# Patient Record
Sex: Female | Born: 1985
Health system: Southern US, Community
[De-identification: ages and names within clinical notes are randomized; demographics above are authoritative.]

## PROBLEM LIST (undated history)

## (undated) DIAGNOSIS — B379 Candidiasis, unspecified: Secondary | ICD-10-CM

## (undated) DIAGNOSIS — Z789 Other specified health status: Secondary | ICD-10-CM

## (undated) DIAGNOSIS — B9689 Other specified bacterial agents as the cause of diseases classified elsewhere: Secondary | ICD-10-CM

## (undated) DIAGNOSIS — Z3042 Encounter for surveillance of injectable contraceptive: Secondary | ICD-10-CM

## (undated) DIAGNOSIS — N910 Primary amenorrhea: Secondary | ICD-10-CM

## (undated) DIAGNOSIS — Z8679 Personal history of other diseases of the circulatory system: Secondary | ICD-10-CM

## (undated) DIAGNOSIS — N760 Acute vaginitis: Principal | ICD-10-CM

## (undated) DIAGNOSIS — L0291 Cutaneous abscess, unspecified: Secondary | ICD-10-CM

## (undated) DIAGNOSIS — Z8619 Personal history of other infectious and parasitic diseases: Secondary | ICD-10-CM

## (undated) HISTORY — DX: Cutaneous abscess, unspecified: L02.91

## (undated) HISTORY — DX: Candidiasis, unspecified: B37.9

## (undated) HISTORY — DX: Primary amenorrhea: N91.0

## (undated) HISTORY — DX: Personal history of other diseases of the circulatory system: Z86.79

## (undated) HISTORY — DX: Personal history of other infectious and parasitic diseases: Z86.19

## (undated) HISTORY — DX: Acute vaginitis: N76.0

## (undated) HISTORY — DX: Other specified bacterial agents as the cause of diseases classified elsewhere: B96.89

## (undated) HISTORY — PX: HERNIA REPAIR: SHX51

---
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
## 2006-02-05 ENCOUNTER — Ambulatory Visit: Payer: Self-pay | Admitting: Internal Medicine

## 2006-02-12 ENCOUNTER — Ambulatory Visit: Payer: Self-pay | Admitting: Internal Medicine

## 2006-02-12 ENCOUNTER — Other Ambulatory Visit: Admission: RE | Admit: 2006-02-12 | Discharge: 2006-02-12 | Payer: Self-pay | Admitting: Internal Medicine

## 2006-02-12 ENCOUNTER — Encounter: Payer: Self-pay | Admitting: Internal Medicine

## 2006-05-08 ENCOUNTER — Ambulatory Visit: Payer: Self-pay | Admitting: Internal Medicine

## 2007-06-07 ENCOUNTER — Telehealth: Payer: Self-pay | Admitting: Internal Medicine

## 2008-03-03 HISTORY — PX: TONSILLECTOMY: SUR1361

## 2008-07-04 ENCOUNTER — Ambulatory Visit: Payer: Self-pay | Admitting: Internal Medicine

## 2008-07-05 ENCOUNTER — Encounter: Payer: Self-pay | Admitting: Internal Medicine

## 2008-07-18 ENCOUNTER — Encounter: Payer: Self-pay | Admitting: *Deleted

## 2009-06-29 ENCOUNTER — Ambulatory Visit: Payer: Self-pay | Admitting: Internal Medicine

## 2009-08-01 LAB — CONVERTED CEMR LAB: Pap Smear: NORMAL

## 2010-03-13 ENCOUNTER — Telehealth: Payer: Self-pay | Admitting: *Deleted

## 2010-03-19 ENCOUNTER — Other Ambulatory Visit: Payer: Self-pay | Admitting: Internal Medicine

## 2010-03-19 ENCOUNTER — Ambulatory Visit
Admission: RE | Admit: 2010-03-19 | Discharge: 2010-03-19 | Payer: Self-pay | Source: Home / Self Care | Attending: Internal Medicine | Admitting: Internal Medicine

## 2010-03-20 LAB — CBC WITH DIFFERENTIAL/PLATELET
Basophils Absolute: 0 10*3/uL (ref 0.0–0.1)
Basophils Relative: 0.3 % (ref 0.0–3.0)
Eosinophils Absolute: 0.1 10*3/uL (ref 0.0–0.7)
Eosinophils Relative: 1.3 % (ref 0.0–5.0)
HCT: 36.5 % (ref 36.0–46.0)
Hemoglobin: 12.5 g/dL (ref 12.0–15.0)
Lymphocytes Relative: 26.9 % (ref 12.0–46.0)
Lymphs Abs: 1.3 10*3/uL (ref 0.7–4.0)
MCHC: 34.2 g/dL (ref 30.0–36.0)
MCV: 94.3 fl (ref 78.0–100.0)
Monocytes Absolute: 0.4 10*3/uL (ref 0.1–1.0)
Monocytes Relative: 7.3 % (ref 3.0–12.0)
Neutro Abs: 3.1 10*3/uL (ref 1.4–7.7)
Neutrophils Relative %: 64.2 % (ref 43.0–77.0)
Platelets: 269 10*3/uL (ref 150.0–400.0)
RBC: 3.88 Mil/uL (ref 3.87–5.11)
RDW: 13.1 % (ref 11.5–14.6)
WBC: 4.9 10*3/uL (ref 4.5–10.5)

## 2010-03-20 LAB — BASIC METABOLIC PANEL
BUN: 13 mg/dL (ref 6–23)
CO2: 29 mEq/L (ref 19–32)
Calcium: 9.5 mg/dL (ref 8.4–10.5)
Chloride: 105 mEq/L (ref 96–112)
Creatinine, Ser: 0.6 mg/dL (ref 0.4–1.2)
GFR: 168.64 mL/min (ref >60.00)
Glucose, Bld: 98 mg/dL (ref 70–99)
Potassium: 3.3 mEq/L — ABNORMAL LOW (ref 3.5–5.1)
Sodium: 141 mEq/L (ref 135–145)

## 2010-03-20 LAB — LIPID PANEL
Cholesterol: 157 mg/dL (ref 0–200)
HDL: 65.4 mg/dL (ref >39.00)
LDL Cholesterol: 77 mg/dL (ref 0–99)
Total CHOL/HDL Ratio: 2
Triglycerides: 74 mg/dL (ref 0.0–149.0)
VLDL: 14.8 mg/dL (ref 0.0–40.0)

## 2010-03-20 LAB — HEPATIC FUNCTION PANEL
ALT: 11 U/L (ref 0–35)
AST: 20 U/L (ref 0–37)
Albumin: 4.4 g/dL (ref 3.5–5.2)
Alkaline Phosphatase: 41 U/L (ref 39–117)
Bilirubin, Direct: 0 mg/dL (ref 0.0–0.3)
Total Bilirubin: 0.4 mg/dL (ref 0.3–1.2)
Total Protein: 7.8 g/dL (ref 6.0–8.3)

## 2010-03-20 LAB — TSH: TSH: 0.45 u[IU]/mL (ref 0.35–5.50)

## 2010-03-21 ENCOUNTER — Ambulatory Visit
Admission: RE | Admit: 2010-03-21 | Discharge: 2010-03-21 | Payer: Self-pay | Source: Home / Self Care | Attending: Internal Medicine | Admitting: Internal Medicine

## 2010-04-02 NOTE — Assessment & Plan Note (Signed)
Summary: sharp pain in neck head, especially when pt bends over/cjr pt...   Vital Signs:  Patient profile:   25 year old female Menstrual status:  irregular LMP:     07/01/2008 Height:      62.5 inches Weight:      144 pounds BMI:     26.01 Temp:     98.5 degrees F oral Pulse rate:   78 / minute BP sitting:   110 / 60  (right arm) Cuff size:   regular  Vitals Entered By: Romualdo Bolk, CMA (AAMA) (June 29, 2009 3:11 PM) CC: Pt has been having sharp pains in the back of her neck when she turns her neck or when she goes from hot to cold. This has been going on for 3 weeks now. Pt has also not had a period since May 2010. She was on depo. Last shot was in July 2010. [facility]  LMP (date): 07/01/2008 LMP - Character: heavy Menarche (age onset years): 12   Menses interval (days): depo Menstrual flow (days): 7 Enter LMP: 07/01/2008   History of Present Illness: Christine Harris   3  weeks of shooting neck  pain lower mid neck and to sides   for about 3 weeks  insidious onset ? going to work  No injury or incitoers except bending  but notices Notice rest better.    and laying down . recently   is more frequent.    4-5 hours  for sleep  no arm pain  .     Is right handed . Puppy at home.    no hx of  concussion or head injury and was in [facility] at age 47 years.   no med currently  Also   amenorrhea   for a year   last dep in July and had period then    last preg test  2 days  .  ago  using condoms     Preventive Screening-Counseling & Management  Alcohol-Tobacco     Alcohol drinks/day: <1     Smoking Status: quit     Year Quit: 01/2009  Caffeine-Diet-Exercise     Caffeine use/day: 4     Does Patient Exercise: yes     Type of exercise: dance  Current Medications (verified): 1)  None  Allergies (verified): 1)  ! Codeine  Past History:  Care Management: Gynecology: [facility] Department Surgery: [place]  abscess  perianal ENT: Dorma Russell  Social History: hh of  4    no pets .  [employer]   12 hours  and [employer].  working  36- 40 hours .   4-5 hours of sleepSmoking Status:  quit Caffeine use/day:  4  Review of Systems  The patient denies anorexia, fever, vision loss, decreased hearing, chest pain, syncope, headaches, difficulty walking, abnormal bleeding, enlarged lymph nodes, and angioedema.    Physical Exam  General:  Well-developed,well-nourished,in no acute distress; alert,appropriate and cooperative throughout examination Head:  normocephalic and atraumatic.   Eyes:  vision grossly intact, pupils equal, pupils round, and pupils reactive to light.   Ears:  R ear normal, L ear normal, and no external deformities.   Nose:  no external deformity and no nasal discharge.   Mouth:  pharynx pink and moist.  tongue piercing noted  Neck:  full ROM and no masses.  slightly tender midline and trapezious right more than left  Lungs:  Normal respiratory effort, chest expands symmetrically. Lungs  are clear to auscultation, no crackles or wheezes. Heart:  Normal rate and regular rhythm. S1 and S2 normal without gallop, murmur, click, rub or other extra sounds. Msk:  normal ROM, no joint warmth, no redness over joints, and no joint deformities.   Pulses:  pulses intact without delay   Extremities:  no clubbing cyanosis or edema  Neurologic:  alert & oriented X3, strength normal in all extremities, gait normal, and DTRs symmetrical and normal.   Skin:  turgor normal, color normal, and tattoo(s).   Cervical Nodes:  No lymphadenopathy noted Psych:  Oriented X3, good eye contact, not anxious appearing, and not depressed appearing.  cognition normal.   Impression & Recommendations:  Problem # 1:  NECK PAIN (ICD-723.1) Mechanical   cause     ;sleep deprivations adding   HO on exercise and posture given also   no weakness or radicular signs  The following medications were removed from the medication list:    Co-gesic  5-500 Mg Tabs (Hydrocodone-acetaminophen) .[name]... 1 by mouth q4-6 hours as needed pain Her updated medication list for this problem includes:    Naproxen 500 Mg Tabs (Naproxen) .[name]... 1 by mouth two times a day  as directed for neck pan    Cyclobenzaprine Hcl 10 Mg Tabs (Cyclobenzaprine hcl) .[name]... 1/2 -1  by mouth hs  if needed for muscle spasm  Problem # 2:  OTH GENERAL CNSL&ADVICE CONTRACEPT MANAGEMENT (ICD-V25.09) counseled about   use on contraception     pt may have unrealistic   expectations of ocass period  reliability and  importance  of adherance to taking daily.     disc options.   Complete Medication List: 1)  Naproxen 500 Mg Tabs (Naproxen) .[name].. 1 by mouth two times a day  as directed for neck pan 2)  Cyclobenzaprine Hcl 10 Mg Tabs (Cyclobenzaprine hcl) .... 1/2 -1  by mouth hs  if needed for muscle spasm  Patient Instructions: 1)  i think you have  muscle spasm strain in neck. 2)  take antiinflammatory for 10-14 days and then as needed. 3)  Muscle relaxant may help some but can make your drowsy.  4)  If not getting better in 3 weeks or so then return office visit  or call.  Prescriptions: CYCLOBENZAPRINE HCL 10 MG TABS (CYCLOBENZAPRINE HCL) 1/2 -1  by mouth hs  if needed for muscle spasm  #30 x 0   Entered and Authorized by:   Madelin Headings MD   Signed by:   Madelin Headings MD on 06/29/2009   Method used:   Print then Give to Patient   RxID:   [id number] NAPROXEN 500 MG TABS (NAPROXEN) 1 by mouth two times a day  as directed for neck pan  #40 x 1   Entered and Authorized by:   Madelin Headings MD   Signed by:   Madelin Headings MD on 06/29/2009   Method used:   Print then Give to Patient   RxID:   [id number]

## 2010-04-04 NOTE — Assessment & Plan Note (Signed)
Summary: ppd reading/ssc  Nurse Visit   Allergies: 1)  ! Codeine  PPD Results    Date of reading: 03/21/2010    Results: < 5mm    Interpretation: negative

## 2010-04-04 NOTE — Progress Notes (Signed)
Summary: Pt needs form filled out and cpx before end of next wk  Phone Note Call from Patient Call back at [phone] cell   Caller: Patient Summary of Call: Pt called and is needing a form completed for a job and pt was told that since she was last seen by Dr Fabian Sharp in April 2011, that she would need to sch another cpx with a diff doctor. Pt is needing to get this done before the end of next week. Pls advise.  Initial call taken by: Lucy Antigua,  March 13, 2010 4:43 PM  Follow-up for Phone Call        Spoke to pt and she needs to get this to get this form for work filled out by the end of next week. 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 Pt aware that we will have to discuss this with Dr. Fabian Sharp once she gets back. Can we use a WCC slot? Follow-up by: Romualdo Bolk, CMA Duncan Dull),  March 14, 2010 8:23 AM  Additional Follow-up for Phone Call Additional follow up Details #1::        ok Additional Follow-up by: Madelin Headings MD,  March 14, 2010 6:05 PM    Additional Follow-up for Phone Call Additional follow up Details #2::    Pt aware and appt made. Follow-up by: Romualdo Bolk, CMA (AAMA),  March 15, 2010 8:27 AM

## 2010-04-04 NOTE — Assessment & Plan Note (Signed)
Summary: cpx/ssc   Vital Signs:  Patient profile:   25 year old female Menstrual status:  irregular LMP:     03/14/2010 Height:      62.75 inches Weight:      146 pounds Pulse rate:   80 / minute BP sitting:   110 / 72  (right arm) Cuff size:   regular  Vitals Entered By: Romualdo Bolk, CMA (AAMA) (March 19, 2010 3:58 PM) CC: CPX no pap- Pt has a gyn who does paps. LMP (date): 03/14/2010 LMP - Character: heavy Menarche (age onset years): 12   Menses interval (days): depo Menstrual flow (days): 7 Enter LMP: 03/14/2010 Last PAP Result normal   History of Present Illness: Christine Harris comes in for preventive visit  and form for job aces program [employer]   No concerns  no exposure tp tb . Periods ok was depo n the past. Dr Henderson Cloud for gyne.  Has reportedly have tdap  as a senior in HS.   Preventive Care Screening  Pap Smear:    Date:  08/01/2009    Results:  normal    Preventive Screening-Counseling & Management  Alcohol-Tobacco     Alcohol drinks/day: <1     Smoking Status: quit     Year Quit: 01/2009  Caffeine-Diet-Exercise     Caffeine use/day: 4     Diet Comments: no concerns     Does Patient Exercise: yes     Type of exercise: dance     [facility] Depression Score: no  Hep-HIV-STD-Contraception     Dental Visit-last 6 months no  Safety-Violence-Falls     Seat Belt Use: no     Seat Belt Counseling: to use seat belts when in vehicle     Firearms in the Home: no firearms in the home     Smoke Detectors: yes      Blood Transfusions:  no.    Current Medications (verified): 1)  None  Allergies (verified): 1)  ! Codeine  Past History:  Past medical, surgical, family and social histories (including risk factors) reviewed, and no changes noted (except as noted below).  Past Medical History: Reviewed history from 07/04/2008 and no changes required. Unremarkable 7#7oz c sect ft   Past Surgical History: Reviewed history from 07/04/2008 and no  changes required. Tubes in Ears at age 9 and 2 Umbical Hernia age 36 Tonsillectomy in 01/2008 Surgery for boil on inside buttock Perianal and required Gen anesth2002  Past History:  Care Management: Gynecology: [facility] Department in the past- Dr. Cher Nakai Surgery: Gwenlyn Found  abscess perianal ENT: Dorma Russell  Family History: Reviewed history from 07/04/2008 and no changes required. Father: Healthy Mother: Healthy  Siblings:  Asthma  Social History: Reviewed history from 06/29/2009 and no changes required. 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  Hh of 3  6  hours of sleepDental Care w/in 6 mos.:  no Seat Belt Use:  no Blood Transfusions:  no  Review of Systems       12 system review negative    Physical Exam  General:  Well-developed,well-nourished,in no acute distress; alert,appropriate and cooperative throughout examination Head:  Normocephalic and atraumatic without obvious abnormalities. No apparent alopecia or balding. Eyes:  PERRL, EOMs full, conjunctiva clear  Ears:  R ear normal, L ear normal, no external deformities, and ear piercing(s) noted.   Nose:  no external deformity, no external erythema, and no nasal discharge.   Mouth:  good dentition and pharynx pink and moist.   piercing Neck:  No deformities, masses, or tenderness noted. Breasts:  No mass, nodules, thickening, tenderness, bulging, retraction, inflamation, nipple discharge or skin changes noted.   Lungs:  Normal respiratory effort, chest expands symmetrically. Lungs are clear to auscultation, no crackles or wheezes. Heart:  Normal rate and regular rhythm. S1 and S2 normal without gallop, murmur, click, rub or other extra sounds. Abdomen:  Bowel sounds positive,abdomen soft and non-tender without masses, organomegaly or hernias noted. Genitalia:  per gyne Msk:  no joint swelling, no joint warmth, no redness over joints, and no joint  deformities.   Pulses:  pulses intact without delay   Extremities:  no clubbing cyanosis or edema  Neurologic:  alert & oriented X3, strength normal in all extremities, gait normal, and DTRs symmetrical and normal.   Skin:  turgor normal, color normal, no petechiae, no purpura, tattoo(s), and body piercing(s).   Cervical Nodes:  No lymphadenopathy noted Axillary Nodes:  No palpable lymphadenopathy Inguinal Nodes:  No significant adenopathy Psych:  Normal eye contact, appropriate affect. Cognition appears normal.    Impression & Recommendations:  Problem # 1:  PREVENTIVE HEALTH CARE (ICD-V70.0) hea;thy  ppd placed to day   form  to be completed for  aces [employer]   .  disc screening labs  Orders: TLB-BMP (Basic Metabolic Panel-BMET) (80048-METABOL) TLB-CBC Platelet - w/Differential (85025-CBCD) TLB-TSH (Thyroid Stimulating Hormone) (84443-TSH) TLB-Hepatic/Liver Function Pnl (80076-HEPATIC) TLB-Lipid Panel (80061-LIPID) Specimen Handling (16109) Venipuncture (60454)  Other Orders: TB Skin Test (09811) Admin 1st Vaccine (91478)  Patient Instructions: 1)  check tb reading in 48-72 hours   and get form then 2)  You will be informed of lab results when available.  3)  wear seat belt for injury prevention   Orders Added: 1)  TB Skin Test [86580] 2)  Admin 1st Vaccine [90471] 3)  TLB-BMP (Basic Metabolic Panel-BMET) [80048-METABOL] 4)  TLB-CBC Platelet - w/Differential [85025-CBCD] 5)  TLB-TSH (Thyroid Stimulating Hormone) [84443-TSH] 6)  TLB-Hepatic/Liver Function Pnl [80076-HEPATIC] 7)  TLB-Lipid Panel [80061-LIPID] 8)  Specimen Handling [99000] 9)  Venipuncture [36415] 10)  Est. Patient 18-39 years [99395]   Immunizations Administered:  PPD Skin Test:    Vaccine Type: PPD    Site: right forearm    Mfr: Sanofi Pasteur    Dose: 0.1 ml    Route: ID    Given by: Romualdo Bolk, CMA (AAMA)    Exp. Date: 11/02/2011    Lot #: [id number]   Immunizations  Administered:  PPD Skin Test:    Vaccine Type: PPD    Site: right forearm    Mfr: Sanofi Pasteur    Dose: 0.1 ml    Route: ID    Given by: Romualdo Bolk, CMA (AAMA)    Exp. Date: 11/02/2011    Lot #: [id number]

## 2010-07-19 NOTE — Op Note (Signed)
[place]. [facility]  Patient:    Christine Harris, BREACH Visit Number: [id number] MRN: [id number]          Service Type: DSU Location: [facility] 2550 01 Attending Physician:  Leonia Corona Dictated by:   Judie Petit. Leonia Corona, M.D. Proc. Date: 03/02/01 Admit Date:  03/02/2001 Discharge Date: 03/02/2001                             Operative Report  PREOPERATIVE DIAGNOSIS:  Recurrent perineal abscess.  POSTOPERATIVE DIAGNOSIS:  Recurrent perineal abscess.  PROCEDURE:  Incision and drainage of left perineal abscess.  ANESTHESIA:  General laryngeal mask anesthesia.  SURGEON:  Nelida Meuse, M.D.  ASSISTANT:  Nurse.  INDICATION FOR PROCEDURE:  This 25 year old female child had incision and drainage of her perineal abscess about six months ago, which was completely drained and healed.  The patient has come back with a fluctuant, tender swelling of the same spot consistent with recurrent abscess; hence, the procedure.  DESCRIPTION OF PROCEDURE:  The patient was brought into the operating room, placed supine upon the operating table.  General laryngeal mask anesthesia is given, and the lithotomy position is given to the patient.  The perineal area was cleaned, prepped, and draped in usual manner.  The left buttock perineal a abscess started to leak before surgery.  The center of the 3 x 3 cm swelling was already pointing and leaked.  Pus cultures were obtained for aerobic and anaerobic cultures, and a blunt-tipped hemostat was inserted into the abscess cavity and the incision was made.  It appeared to be a very superficial abscess, about 2 x 2 cm in size.  The entire roof of the abscess was thin and papery, which was excised completely, and _____ of the abscess cavity was done, which was sent for biopsy.  The wound was irrigated and then packed with Vaseline gauze.  This was covered with sterile gauze, and tapes were applied. The patient tolerated the  procedure very well, which was smooth and uneventful.  The patient was later extubated and transported to the recovery room in good and stable condition. Dictated by:   Judie Petit. Leonia Corona, M.D. Attending Physician:  Leonia Corona DD:  03/02/01 TD:  03/02/01 Job: [id number]

## 2010-07-19 NOTE — Op Note (Signed)
[place]. [facility]  Patient:    Christine Harris, Christine Harris Visit Number: [id number] MRN: [id number]          Service Type: DSU Location: [facility] Attending Physician:  Leonia Corona Dictated by:   Judie Petit. Leonia Corona, M.D. Admit Date:  09/02/2001 Discharge Date: 09/02/2001   CC:         Neta Mends. Panosh, M.D. [facility]   Operative Report  PREOPERATIVE DIAGNOSIS:  Perineal abscess.  POSTOPERATIVE DIAGNOSIS:  Perineal abscess.  PROCEDURE:  Incision and drainage.  ANESTHESIA:  Local anesthesia.  SURGEON:  Nelida Meuse, M.D.  ASSISTANT:  Nurse.  DESCRIPTION OF PROCEDURE:  The patient is brought into minor operating room, where she was placed left laterally and with the right lower extremity folded into the chest and then the perineal abscess was clearly visible.  At this point approximately 3 cc of 1% lidocaine with epinephrine was infiltrated in and around the tiny abscess, which was almost pointing.  After local anesthesia, a small incision about 1 cm was made right at the tip of the abscess and the abscess was drained completely.  The cavity was irrigated with saline and packed with gauze smeared with Neosporin ointment.  A sterile gauze was placed, which was covered with Tegaderm dressing, which was covered with tape dressing.  The patient tolerated the procedure very well, which was smooth and uneventful.  The patient was later allowed to go home with instructions to continue daily dressing changes. Dictated by:   Judie Petit. Leonia Corona, M.D. Attending Physician:  Leonia Corona DD:  09/02/01 TD:  09/06/01 Job: [id number]

## 2010-07-19 NOTE — Op Note (Signed)
[place]. [facility]  Patient:    Christine Harris, Christine Harris Visit Number: [id number] MRN: [id number]          Service Type: DSU Location: [facility] Attending Physician:  Leonia Corona Proc. Date: 09/17/00 Adm. Date:  09/17/2000   CC:         Neta Mends. Panosh, M.D. [facility]   Operative Report  PREOPERATIVE DIAGNOSIS:  Perianal abscess.  POSTOPERATIVE DIAGNOSIS:  Perianal abscess.  PROCEDURE PERFORMED:  Examination under anesthesia with incision and drainage of perirectal abscess.  SURGEON: Evalee Mutton. Leeanne Mannan, M.D.  ASSISTANT:  Nurse.  ANESTHESIA:  General laryngeal mask anesthesia.  PROCEDURE IN DETAIL:  The patient was brought into the operating room and placed supine on the operating table.  General laryngeal mask anesthesia was induced.  The patient was moved into the lithotomy position.  Before beginning the incision and drainage, we did a perianal examination under anesthesia. The perivaginal examination and labial examination was done to rule out the possibility of a Bartholin cyst infection.  A superficial vaginal examination with the speculum was also done to see if there was a fistulous connection between the outer portion of the vaginal canal.  No such fistula was noted. We then did a digital rectal examination and no internal opening of the or communication with the abscess cavity was noted.  We then cleaned and prepped the patient and did a long elliptical incision over the swelling and a piece of skin and abscess wall were sent for biopsy.  A minimal amount of the abscess was drained.  After superficial abscess cavity, we noted another deeper cavity, which was also opened with blunt tip hemostat and the wall incised with cautery and the abscess cavity the finger was inserted in the deeper perirectal cavity and ______ and the minimal amount pus drained.  It had a very thick fibrous wall a part piece of which was also taken for biopsy. It appeared  that this must be the previous abscess cavity, which was drained about a year ago and healed.  Maybe this is a fibrous cavity of the previous abscess incompletely healed.  After draining the superficial and deeper cavities completely and probing with a finger, the flushed the abscess cavities with peroxide and then packed it with Iodoform gauze.  Sterile gauze was placed over it and tape was applied.  The patient tolerated the procedure very well, which was ______ patient was later extubated and transported to recovery room in good and stable condition. Attending Physician:  Leonia Corona DD:  09/17/00 TD:  09/17/00 Job: [id number]

## 2010-12-05 DIAGNOSIS — B9689 Other specified bacterial agents as the cause of diseases classified elsewhere: Secondary | ICD-10-CM

## 2010-12-05 HISTORY — DX: Other specified bacterial agents as the cause of diseases classified elsewhere: B96.89

## 2011-06-11 DIAGNOSIS — B9689 Other specified bacterial agents as the cause of diseases classified elsewhere: Secondary | ICD-10-CM | POA: Insufficient documentation

## 2011-06-12 ENCOUNTER — Other Ambulatory Visit: Payer: Self-pay

## 2011-06-19 ENCOUNTER — Other Ambulatory Visit: Payer: Self-pay

## 2011-06-23 ENCOUNTER — Other Ambulatory Visit: Payer: Self-pay

## 2011-10-29 ENCOUNTER — Encounter: Payer: Self-pay | Admitting: Internal Medicine

## 2011-10-29 ENCOUNTER — Ambulatory Visit (INDEPENDENT_AMBULATORY_CARE_PROVIDER_SITE_OTHER): Payer: BC Managed Care – PPO | Admitting: Internal Medicine

## 2011-10-29 VITALS — BP 96/60 | HR 76 | Temp 98.6°F | Wt 146.0 lb

## 2011-10-29 DIAGNOSIS — Z789 Other specified health status: Secondary | ICD-10-CM | POA: Insufficient documentation

## 2011-10-29 DIAGNOSIS — Z9189 Other specified personal risk factors, not elsewhere classified: Secondary | ICD-10-CM

## 2011-10-29 DIAGNOSIS — L0292 Furuncle, unspecified: Secondary | ICD-10-CM

## 2011-10-29 DIAGNOSIS — L0293 Carbuncle, unspecified: Secondary | ICD-10-CM

## 2011-10-29 DIAGNOSIS — Z113 Encounter for screening for infections with a predominantly sexual mode of transmission: Secondary | ICD-10-CM

## 2011-10-29 DIAGNOSIS — Z309 Encounter for contraceptive management, unspecified: Secondary | ICD-10-CM

## 2011-10-29 DIAGNOSIS — N912 Amenorrhea, unspecified: Secondary | ICD-10-CM

## 2011-10-29 MED ORDER — CEPHALEXIN 500 MG PO CAPS: 500.0000 mg | ORAL_CAPSULE | Freq: Three times a day (TID) | ORAL | Status: AC

## 2011-10-29 NOTE — Patient Instructions (Addendum)
Continue to use warm compresses and dont shave and add antibiotic   For now. We will get a blood pregnancy test and if negative can begin  The depo shots again.  Fu depending  On labs  .   Plan Check  With pap smear in a month or so

## 2011-10-29 NOTE — Progress Notes (Signed)
  Subjective:    Patient ID: Christine Harris, female    DOB: Nov 20, 1985, 26 y.o.   MRN: [id number]  HPI Patient comes in for acute care problem today. She's been having problems off and on with skin boils that sometimes will drain on her and for while. 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  She had been on Depo-Provera for about a year and a half and then lost her health insurance when she lost her job. Her last Depoprovera  was in January and she hasn't had a period since then although did have some brownish spotting this past month. She uses condoms most of the time but not 100%. Like to go back on the Depo-Provera. She did a pregnancy test recently and it was negative last exposure was about 3 weeks ago. Now back to [employer] .   Review of Systems Neg fever cp other skin infections  abd pain uti sx or unusual discharge except as above.  Hx of bv   Past history family history social history reviewed in the electronic medical record.     Objective:   Physical Exam BP 96/60  Pulse 76  Temp 98.6 F (37 C) (Oral)  Wt 146 lb (66.225 kg)  SpO2 98% wdwn in nad Abdomen:  Sof,t normal bowel sounds without hepatosplenomegaly, no guarding rebound or masses no CVA tenderness EXT GU  Pierced  Clear but in suprapubic area multiple hair based small boils an papular pustular areas  Reactive ln no ulcers   No clubbing cyanosis or edema     Assessment & Plan:  Boils recurrent on pubic area   No discrete abscess today but indurations and boils.   No shaving  Antibiotic an local care  Contraception Amenorrhea sp depo using condoms  But would do blood test before restarting  And lab  hiv aby tsh and pl.  If ok then can com in for depo provera .    Apparently has appt with dr Pennie Rushing  In October   To keep   Will need pap. Pelvic due

## 2011-10-30 LAB — HIV ANTIBODY (ROUTINE TESTING W REFLEX): HIV: NONREACTIVE

## 2011-10-30 LAB — CBC WITH DIFFERENTIAL/PLATELET
Eosinophils Absolute: 0.1 10*3/uL (ref 0.0–0.7)
MCHC: 33.4 g/dL (ref 30.0–36.0)
MCV: 93.3 fl (ref 78.0–100.0)
Monocytes Absolute: 0.3 10*3/uL (ref 0.1–1.0)
Neutrophils Relative %: 63.8 % (ref 43.0–77.0)
Platelets: 261 10*3/uL (ref 150.0–400.0)

## 2011-10-30 LAB — GC/CHLAMYDIA PROBE AMP, URINE: Chlamydia, Swab/Urine, PCR: NEGATIVE

## 2011-10-30 LAB — HCG, QUANTITATIVE, PREGNANCY: hCG, Beta Chain, Quant, S: 0.14 m[IU]/mL

## 2011-10-30 LAB — RPR

## 2011-11-06 ENCOUNTER — Ambulatory Visit (INDEPENDENT_AMBULATORY_CARE_PROVIDER_SITE_OTHER): Payer: BC Managed Care – PPO

## 2011-11-06 DIAGNOSIS — Z309 Encounter for contraceptive management, unspecified: Secondary | ICD-10-CM

## 2011-11-06 MED ORDER — MEDROXYPROGESTERONE ACETATE 150 MG/ML IM SUSP
150.0000 mg | Freq: Once | INTRAMUSCULAR | Status: AC
  Administered 2011-11-06: 150 mg via INTRAMUSCULAR

## 2011-12-08 ENCOUNTER — Ambulatory Visit (INDEPENDENT_AMBULATORY_CARE_PROVIDER_SITE_OTHER): Payer: BC Managed Care – PPO | Admitting: Obstetrics and Gynecology

## 2011-12-08 ENCOUNTER — Encounter: Payer: Self-pay | Admitting: Obstetrics and Gynecology

## 2011-12-08 VITALS — BP 100/68 | Ht 62.5 in | Wt 153.0 lb

## 2011-12-08 DIAGNOSIS — Z2089 Contact with and (suspected) exposure to other communicable diseases: Secondary | ICD-10-CM

## 2011-12-08 DIAGNOSIS — N949 Unspecified condition associated with female genital organs and menstrual cycle: Secondary | ICD-10-CM

## 2011-12-08 DIAGNOSIS — N898 Other specified noninflammatory disorders of vagina: Secondary | ICD-10-CM

## 2011-12-08 DIAGNOSIS — Z202 Contact with and (suspected) exposure to infections with a predominantly sexual mode of transmission: Secondary | ICD-10-CM

## 2011-12-08 LAB — POCT WET PREP (WET MOUNT)
Clue Cells Wet Prep Whiff POC: POSITIVE
pH: 5.5

## 2011-12-08 LAB — POCT OSOM BVBLUE TEST: Bacterial Vaginosis: POSITIVE

## 2011-12-08 MED ORDER — FLUCONAZOLE 150 MG PO TABS: 150.0000 mg | ORAL_TABLET | Freq: Once | ORAL | Status: DC

## 2011-12-08 MED ORDER — METRONIDAZOLE 0.75 % VA GEL: VAGINAL | Status: DC

## 2011-12-08 MED ORDER — METRONIDAZOLE 500 MG PO TABS: ORAL_TABLET | ORAL | Status: DC

## 2011-12-08 NOTE — Progress Notes (Signed)
Subjective:  Christine Harris is a 26 y.o. female, No obstetric history on file., who presents for an annual exam.   Pt with vaginal odor after IC since 12/04/11. No vaginal discharge.  Last Pap: 12/05/2010 WNL: Yes Regular Periods:no Contraception: Depo Provera  Monthly Breast exam:yes Tetanus<61yrs:? Nl.Bladder Function:no error Normal urinary functions Daily BMs:no Healthy Diet:yes Calcium:no Mammogram:no Date of Mammogram: n/a Exercise:yes Have often Exercise: walk daily Seatbelt: yes Abuse at home: no Stressful work:no Sigmoid-colonoscopy: n/a Bone Density: No PCP: Dr. Fabian Sharp Change in PMH: no change  Change in FMH:no change    History   Social History  . Marital Status: Single    Spouse Name: N/A    Number of Children: N/A  . Years of Education: N/A   Social History Main Topics  . Smoking status: Never Smoker   . Smokeless tobacco: None  . Alcohol Use: Yes  . Drug Use: No  . Sexually Active: Yes    Birth Control/ Protection: Condom, Injection   Other Topics Concern  . 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 Patient has had an injection.           Cycle: amenorrhea with Depo Provera  The following portions of the patient's history were reviewed and updated as appropriate: allergies, current medications, past family history, past medical history, past social history, past surgical history and problem list.  Review of Systems Pertinent items are noted in HPI. Breast:Negative for breast lump,nipple discharge or nipple retraction Gastrointestinal: Negative for abdominal pain, change in bowel habits or rectal bleeding Urinary:negative   Objective:    Ht 5' 2.5" (1.588 m)  Wt 153 lb (69.4 kg)  BMI 27.54 kg/m2    Weight:  Wt Readings from Last 1 Encounters:  12/08/11 153 lb (69.4 kg)          BMI: Body mass index is 27.54 kg/(m^2).  General Appearance:  Alert, appropriate appearance for age. No acute distress HEENT: Grossly normal Neck / Thyroid: Supple, no masses, nodes or enlargement Lungs: clear to auscultation bilaterally Back: No CVA tenderness Breast Exam: No masses or nodes.No dimpling, nipple retraction or discharge. Bilateral nipple piercings Cardiovascular: Regular rate and rhythm. S1, S2, no murmur Gastrointestinal: Soft, non-tender, no masses or organomegaly Pelvic Exam: External genitalia: piercings of clitoris Vaginal: normal rugae and discharge, white Cervix: normal appearance and cervical motion tenderness; mild Adnexa: no masses Uterus: normal single, nontender Rectovaginal: normal rectal, no masses Lymphatic Exam: Non-palpable nodes in neck, clavicular, axillary, or inguinal regions Skin: no rash or abnormalities Neurologic: Normal gait and speech, no tremor  Psychiatric: Alert and oriented, appropriate affect.   Wet Prep:positive clue cells, pH 5 and positive whiff test Urinalysis:not applicable UPT: Not done   Assessment:    BV recurrent    Plan:   wants to continue Depo Provera Metrogel for 7 days followed by metronidazole weekly for 12 wks.   Diflucan to be used after Metrogel GC/CHL return annually or prn STD screening: done Contraception:  Depo Provera  Dierdre Forth MD

## 2013-03-22 ENCOUNTER — Ambulatory Visit (INDEPENDENT_AMBULATORY_CARE_PROVIDER_SITE_OTHER): Payer: 59 | Admitting: Internal Medicine

## 2013-03-22 ENCOUNTER — Other Ambulatory Visit (HOSPITAL_COMMUNITY)
Admission: RE | Admit: 2013-03-22 | Discharge: 2013-03-22 | Disposition: A | Discharge status: Home / Self Care | Payer: 59 | Source: Ambulatory Visit | Attending: Internal Medicine | Admitting: Internal Medicine

## 2013-03-22 ENCOUNTER — Encounter: Payer: Self-pay | Admitting: Internal Medicine

## 2013-03-22 VITALS — BP 110/70 | Temp 98.9°F | Wt 172.0 lb

## 2013-03-22 DIAGNOSIS — N949 Unspecified condition associated with female genital organs and menstrual cycle: Secondary | ICD-10-CM

## 2013-03-22 DIAGNOSIS — Z3009 Encounter for other general counseling and advice on contraception: Secondary | ICD-10-CM

## 2013-03-22 DIAGNOSIS — N898 Other specified noninflammatory disorders of vagina: Secondary | ICD-10-CM

## 2013-03-22 DIAGNOSIS — N760 Acute vaginitis: Secondary | ICD-10-CM | POA: Insufficient documentation

## 2013-03-22 DIAGNOSIS — Z01419 Encounter for gynecological examination (general) (routine) without abnormal findings: Secondary | ICD-10-CM | POA: Insufficient documentation

## 2013-03-22 DIAGNOSIS — N938 Other specified abnormal uterine and vaginal bleeding: Secondary | ICD-10-CM

## 2013-03-22 DIAGNOSIS — N63 Unspecified lump in unspecified breast: Secondary | ICD-10-CM

## 2013-03-22 DIAGNOSIS — N910 Primary amenorrhea: Secondary | ICD-10-CM

## 2013-03-22 DIAGNOSIS — Z113 Encounter for screening for infections with a predominantly sexual mode of transmission: Secondary | ICD-10-CM | POA: Insufficient documentation

## 2013-03-22 LAB — POCT URINE PREGNANCY: PREG TEST UR: NEGATIVE

## 2013-03-22 MED ORDER — METRONIDAZOLE 500 MG PO TABS: 500.0000 mg | ORAL_TABLET | Freq: Two times a day (BID) | ORAL | Status: DC

## 2013-03-22 NOTE — Patient Instructions (Addendum)
Follow the breast for now  I dont feel a lump for now .   Treat for  Bacterial vaginosis   I  suspect. Double method for contraception. Consider IUD  See you GYNE.    Bacterial Vaginosis Bacterial vaginosis is a vaginal infection that occurs when the normal balance of bacteria in the vagina is disrupted. It results from an overgrowth of certain bacteria. This is the most common vaginal infection in women of childbearing age. Treatment is important to prevent complications, especially in pregnant women, as it can cause a premature delivery. CAUSES  Bacterial vaginosis is caused by an increase in harmful bacteria that are normally present in smaller amounts in the vagina. Several different kinds of bacteria can cause bacterial vaginosis. However, the reason that the condition develops is not fully understood. RISK FACTORS Certain activities or behaviors can put you at an increased risk of developing bacterial vaginosis, including:  Having a new sex partner or multiple sex partners.  Douching.  Using an intrauterine device (IUD) for contraception. Women do not get bacterial vaginosis from toilet seats, bedding, swimming pools, or contact with objects around them. SIGNS AND SYMPTOMS  Some women with bacterial vaginosis have no signs or symptoms. Common symptoms include:  Grey vaginal discharge.  A fishlike odor with discharge, especially after sexual intercourse.  Itching or burning of the vagina and vulva.  Burning or pain with urination. DIAGNOSIS  Your health care provider will take a medical history and examine the vagina for signs of bacterial vaginosis. A sample of vaginal fluid may be taken. Your health care provider will look at this sample under a microscope to check for bacteria and abnormal cells. A vaginal pH test may also be done.  TREATMENT  Bacterial vaginosis may be treated with antibiotic medicines. These may be given in the form of a pill or a vaginal cream. A second  round of antibiotics may be prescribed if the condition comes back after treatment.  HOME CARE INSTRUCTIONS   Only take over-the-counter or prescription medicines as directed by your health care provider.  If antibiotic medicine was prescribed, take it as directed. Make sure you finish it even if you start to feel better.  Do not have sex until treatment is completed.  Tell all sexual partners that you have a vaginal infection. They should see their health care provider and be treated if they have problems, such as a mild rash or itching.  Practice safe sex by using condoms and only having one sex partner. SEEK MEDICAL CARE IF:   Your symptoms are not improving after 3 days of treatment.  You have increased discharge or pain.  You have a fever. MAKE SURE YOU:   Understand these instructions.  Will watch your condition.  Will get help right away if you are not doing well or get worse. FOR MORE INFORMATION  Centers for Disease Control and Prevention, Division of STD Prevention: SolutionApps.co.zawww.cdc.gov/std American Sexual Health Association (ASHA): www.ashastd.org  Document Released: 02/17/2005 Document Revised: 12/08/2012 Document Reviewed: 09/29/2012 [facility] Patient Information 2014 [place], [place].

## 2013-03-22 NOTE — Progress Notes (Signed)
Pre visit review using our clinic review tool, if applicable. No additional management support is needed unless otherwise documented below in the visit note. 

## 2013-03-22 NOTE — Progress Notes (Signed)
Chief Complaint  Patient presents with  . Vaginal Discharge  . Breast Problem    left - soreness    HPI: Patient comes in today for SDA for  problem evaluation. Last ov was 20 8 2013  Npo insurance . Over the last 1-1/2 months has noted left breast became fuller and thinks it's different than the right. No discharge or lumps. She has bilateral pierced nipples. She had unintended pregnancy using condoms alone in the fall and had a selective therapeutic termination at about 6-8 weeks on December 5. She states the had a routine followup 2 weeks later that was okay. She has an appointment with her gynecologist in a couple months.  However didn't want to wait that long because she's had an odor vaginal discharge over the last 2 weeks. Was on Depo-Provera before pregnancy and off for about 6 months. He hasn't had a period since the termination and hasn't checked a pregnancy test. Sexual exposure condoms a few weeks ago.   Has had routine gyne check and screen and bv rx  Left  Breast larger than right   ROS: See pertinent positives and negatives per HPI.  Past Medical History  Diagnosis Date  . Bacterial vaginosis 12/05/2010  . H/O varicella   . H/O: varicose veins   . Yeast infection   . Abscess     surgery consult 2003    Family History  Problem Relation Age of Onset  . Diabetes Maternal Grandmother   . Diabetes Maternal Grandfather   . Colon cancer Maternal Grandmother   . Hyperlipidemia Maternal Grandmother   . Heart disease Maternal Grandmother   . Asthma Paternal Grandmother     History   Social History  . Marital Status: Single    Spouse Name: N/A    Number of Children: N/A  . Years of Education: N/A   Social History Main Topics  . Smoking status: Never Smoker   . Smokeless tobacco: Never Used  . Alcohol Use: Yes  . Drug Use: No  . Sexual Activity: Yes    Birth Control/ Protection: Condom, Injection   Other Topics Concern  . None   Social History Narrative   Living at home      [facility] G. 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    Outpatient Encounter Prescriptions as of 03/22/2013  Medication Sig  . metroNIDAZOLE (FLAGYL) 500 MG tablet Take 1 tablet (500 mg total) by mouth 2 (two) times daily.  . [DISCONTINUED] fluconazole (DIFLUCAN) 150 MG tablet Take 1 tablet (150 mg total) by mouth once.  . [DISCONTINUED] medroxyPROGESTERone (DEPO-PROVERA) 150 MG/ML injection Inject 150 mg into the muscle every 3 (three) months.  . [DISCONTINUED] metroNIDAZOLE (FLAGYL) 500 MG tablet Pt to take 1 tablet by mouth once weekly x 12 weeks  . [DISCONTINUED] metroNIDAZOLE (METROGEL VAGINAL) 0.75 % vaginal gel Pt to insert 1 applicator full per vagina qhs x 5days    EXAM:  BP 110/70  Temp(Src) 98.9 F (37.2 C) (Oral)  Wt 172 lb (78.019 kg)  SpO2 97%  Body mass index is 30.94 kg/(m^2).  GENERAL: vitals reviewed and listed above, alert, oriented, appears well hydrated and in no acute distress HEENT: atraumatic, conjunctiva  clear, no obvious abnormalities on inspection of external nose and ears OP : no lesion edema or exudate  NECK: no obvious masses on inspection palpation  Breasts appear almost symmetric bilaterally a left little fuller than right lumpiness upper quadrants but no  specific masses are felt or deformities. Nipples are pierced no discharge axilla is clear CV: HRRR, no clubbing cyanosis or  peripheral edema nl cap refill  Them and soft without organomegaly guarding or rebound MS: moves all extremities without noticeable focal  abnormality PSYCH: pleasant and cooperative, no obvious depression or anxiety External GU normal pelvic off clear thin transparent mucoid discharge creamy to yellow white discharge in the vaginal vault no blood no mucopus Bimanual negative CMT or obvious masses Urine pregnancy negative. ASSESSMENT AND PLAN:  Discussed the following assessment and plan:  Vaginal discharge - prob  bv pap and vaginalysis sent  lab - Plan: Cytology - PAP  Breast swelling  Delayed menses - 6 week tab dec 5th .  - Plan: Cytology - PAP, POCT urine pregnancy  Other general counseling and advice for contraceptive management Suspect breast symptoms are related to her recent pregnancy and I don't feel a mass and I would observe it over time and have her gynecologist check in a few months. Vaginal discharge may be the BP lab tests are sent empiric treatment since she has a negative pregnancy test Discussed contraception and the failure that she had on condoms alone. Consider IUD etc. Discuss with her GYN -Patient advised to return or notify health care team  if symptoms worsen or persist or new concerns arise.  Patient Instructions  Follow the breast for now  I dont feel a lump for now .   Treat for  Bacterial vaginosis   I  suspect. Double method for contraception. Consider IUD  See you GYNE.    Bacterial Vaginosis Bacterial vaginosis is a vaginal infection that occurs when the normal balance of bacteria in the vagina is disrupted. It results from an overgrowth of certain bacteria. This is the most common vaginal infection in women of childbearing age. Treatment is important to prevent complications, especially in pregnant women, as it can cause a premature delivery. CAUSES  Bacterial vaginosis is caused by an increase in harmful bacteria that are normally present in smaller amounts in the vagina. Several different kinds of bacteria can cause bacterial vaginosis. However, the reason that the condition develops is not fully understood. RISK FACTORS Certain activities or behaviors can put you at an increased risk of developing bacterial vaginosis, including:  Having a new sex partner or multiple sex partners.  Douching.  Using an intrauterine device (IUD) for contraception. Women do not get bacterial vaginosis from toilet seats, bedding, swimming pools, or contact with objects around  them. SIGNS AND SYMPTOMS  Some women with bacterial vaginosis have no signs or symptoms. Common symptoms include:  Grey vaginal discharge.  A fishlike odor with discharge, especially after sexual intercourse.  Itching or burning of the vagina and vulva.  Burning or pain with urination. DIAGNOSIS  Your health care provider will take a medical history and examine the vagina for signs of bacterial vaginosis. A sample of vaginal fluid may be taken. Your health care provider will look at this sample under a microscope to check for bacteria and abnormal cells. A vaginal pH test may also be done.  TREATMENT  Bacterial vaginosis may be treated with antibiotic medicines. These may be given in the form of a pill or a vaginal cream. A second round of antibiotics may be prescribed if the condition comes back after treatment.  HOME CARE INSTRUCTIONS   Only take over-the-counter or prescription medicines as directed by your health care provider.  If antibiotic medicine was prescribed, take it as  directed. Make sure you finish it even if you start to feel better.  Do not have sex until treatment is completed.  Tell all sexual partners that you have a vaginal infection. They should see their health care provider and be treated if they have problems, such as a mild rash or itching.  Practice safe sex by using condoms and only having one sex partner. SEEK MEDICAL CARE IF:   Your symptoms are not improving after 3 days of treatment.  You have increased discharge or pain.  You have a fever. MAKE SURE YOU:   Understand these instructions.  Will watch your condition.  Will get help right away if you are not doing well or get worse. FOR MORE INFORMATION  Centers for Disease Control and Prevention, Division of STD Prevention: SolutionApps.co.za American Sexual Health Association (ASHA): www.ashastd.org  Document Released: 02/17/2005 Document Revised: 12/08/2012 Document Reviewed:  09/29/2012 [facility] Patient Information 2014 [place], [place].    Neta Mends. Panosh M.D.

## 2013-03-23 ENCOUNTER — Encounter: Payer: Self-pay | Admitting: Internal Medicine

## 2013-03-23 DIAGNOSIS — N910 Primary amenorrhea: Secondary | ICD-10-CM | POA: Insufficient documentation

## 2013-03-23 DIAGNOSIS — Z3009 Encounter for other general counseling and advice on contraception: Secondary | ICD-10-CM | POA: Insufficient documentation

## 2013-03-23 DIAGNOSIS — N63 Unspecified lump in unspecified breast: Secondary | ICD-10-CM | POA: Insufficient documentation

## 2013-03-23 DIAGNOSIS — N898 Other specified noninflammatory disorders of vagina: Secondary | ICD-10-CM | POA: Insufficient documentation

## 2013-03-23 HISTORY — DX: Primary amenorrhea: N91.0

## 2013-04-03 NOTE — Progress Notes (Signed)
Quick Note:  Tell patient PAP is normal.  Labs show Bacterial vaginosis as we suspected .  FU with OB/ gyne if continuing problems ______

## 2013-04-30 ENCOUNTER — Ambulatory Visit (INDEPENDENT_AMBULATORY_CARE_PROVIDER_SITE_OTHER): Payer: 59 | Admitting: Family Medicine

## 2013-04-30 VITALS — BP 118/70 | HR 70 | Temp 97.8°F | Resp 16 | Ht 62.75 in | Wt 170.0 lb

## 2013-04-30 DIAGNOSIS — H6691 Otitis media, unspecified, right ear: Secondary | ICD-10-CM

## 2013-04-30 DIAGNOSIS — H6690 Otitis media, unspecified, unspecified ear: Secondary | ICD-10-CM

## 2013-04-30 DIAGNOSIS — R05 Cough: Secondary | ICD-10-CM

## 2013-04-30 DIAGNOSIS — R059 Cough, unspecified: Secondary | ICD-10-CM

## 2013-04-30 DIAGNOSIS — J029 Acute pharyngitis, unspecified: Secondary | ICD-10-CM

## 2013-04-30 LAB — POCT RAPID STREP A (OFFICE): Rapid Strep A Screen: NEGATIVE

## 2013-04-30 MED ORDER — AMOXICILLIN 875 MG PO TABS: 875.0000 mg | ORAL_TABLET | Freq: Two times a day (BID) | ORAL | Status: DC

## 2013-04-30 MED ORDER — PROMETHAZINE-DM 6.25-15 MG/5ML PO SYRP: 2.5000 mL | ORAL_SOLUTION | Freq: Four times a day (QID) | ORAL | Status: DC | PRN

## 2013-04-30 MED ORDER — FLUCONAZOLE 150 MG PO TABS: 150.0000 mg | ORAL_TABLET | Freq: Every day | ORAL | Status: DC

## 2013-04-30 NOTE — Progress Notes (Signed)
 Chief Complaint:  Chief Complaint  Patient presents with  . Otalgia    x 1 day  right   . Sore Throat    x 1 week     HPI: Christine Harris is a 28 y.o. female who is here for  Sore throat x 1 week, right ear pain x 2 days.  Has not had any fevers or chills. She feels like someone punched her in her throat, hurt to swallow, very scratchy.  When she bends over but she feels her right ear has more pressure. She denies HA, vision changes, syncope or presyncope, dizziness Denies facial pain, SOB, wheezing, voice changes  Past Medical History  Diagnosis Date  . Bacterial vaginosis 12/05/2010  . H/O varicella   . H/O: varicose veins   . Yeast infection   . Abscess     surgery consult 2003   Past Surgical History  Procedure Laterality Date  . Tonsillectomy  2010  . Hernia repair  age 89   History   Social History  . Marital Status: Single    Spouse Name: N/A    Number of Children: N/A  . Years of Education: N/A   Social History Main Topics  . Smoking status: Never Smoker   . Smokeless tobacco: Never Used  . Alcohol Use: Yes  . Drug Use: No  . Sexual Activity: Yes    Birth Control/ Protection: Condom, Injection   Other Topics Concern  . None   Social History Narrative   Living at home      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   Family History  Problem Relation Age of Onset  . Diabetes Maternal Grandmother   . Colon cancer Maternal Grandmother   . Hyperlipidemia Maternal Grandmother   . Heart disease Maternal Grandmother   . Cancer Maternal Grandmother   . Diabetes Maternal Grandfather   . Asthma Paternal Grandmother   . Heart disease Paternal Grandmother   . Hyperlipidemia Paternal Grandmother   . Stroke Paternal Grandmother    Allergies  Allergen Reactions  . Codeine     REACTION: itching   Prior to Admission medications   Medication Sig Start Date End Date Taking? Authorizing Provider    metroNIDAZOLE (FLAGYL) 500 MG tablet Take 1 tablet (500 mg total) by mouth 2 (two) times daily. 03/22/13  Yes Madelin HeadingsWanda K Panosh, MD     ROS: The patient denies fevers, chills, night sweats, unintentional weight loss, chest pain, palpitations, wheezing, dyspnea on exertion, nausea, vomiting, abdominal pain, dysuria, hematuria, melena, numbness, weakness, or tingling.   All other systems have been reviewed and were otherwise negative with the exception of those mentioned in the HPI and as above.    PHYSICAL EXAM: Filed Vitals:   04/30/13 1700  BP: 118/70  Pulse: 70  Temp: 97.8 F (36.6 C)  Resp: 16   Filed Vitals:   04/30/13 1700  Height: 5' 2.75" (1.594 m)  Weight: 170 lb (77.111 kg)   Body mass index is 30.35 kg/(m^2).  General: Alert, no acute distress HEENT:  Normocephalic, atraumatic, oropharynx patent. EOMI, PERRLA. Right TM erythematous, erythemaotus throat. No sinus pressure. NO exudates, tonsils are not present Cardiovascular:  Regular rate and rhythm, no rubs murmurs or gallops.  No Carotid bruits, radial pulse intact. No pedal edema.  Respiratory: Clear to auscultation bilaterally.  No wheezes, rales, or rhonchi.  No cyanosis, no use of accessory musculature GI:  No organomegaly, abdomen is soft and non-tender, positive bowel sounds.  No masses. Skin: No rashes. Neurologic: Facial musculature symmetric. Psychiatric: Patient is appropriate throughout our interaction. Lymphatic: No cervical lymphadenopathy Musculoskeletal: Gait intact.   LABS: Results for orders placed in visit on 04/30/13  POCT RAPID STREP A (OFFICE)      Result Value Ref Range   Rapid Strep A Screen Negative  Negative     EKG/XRAY:   Primary read interpreted by Dr. Conley Rolls at [facility].   ASSESSMENT/PLAN: Encounter Diagnoses  Name Primary?  . Sore throat   . Otitis media of right ear Yes  . Cough    Rx amoxacillin Rx Promethezine DM Salt water gargle F/u prn   Gross sideeffects, risk and  benefits, and alternatives of medications d/w patient. Patient is aware that all medications have potential sideeffects and we are unable to predict every sideeffect or drug-drug interaction that may occur.  ,  PHUONG, DO 04/30/2013 6:00 PM

## 2013-04-30 NOTE — Patient Instructions (Signed)
Otitis Media, Adult °Otitis media is redness, soreness, and puffiness (swelling) in the space just behind your eardrum (middle ear). It may be caused by allergies or infection. It often happens along with a cold. °HOME CARE °· Take your medicine as told. Finish it even if you start to feel better. °· Only take over-the-counter or prescription medicines for pain, discomfort, or fever as told by your doctor. °· Follow up with your doctor as told. °GET HELP IF: °· You have otitis media only in one ear or bleeding from your nose or both. °· You notice a lump on your neck. °· You are not getting better in 3 5 days. °· You feel worse instead of better. °GET HELP RIGHT AWAY IF:  °· You have pain that is not helped with medicine. °· You have puffiness, redness, or pain around your ear. °· You get a stiff neck. °· You cannot move part of your face (paralysis). °· You notice that the bone behind your ear hurts when you touch it. °MAKE SURE YOU:  °· Understand these instructions. °· Will watch your condition. °· Will get help right away if you are not doing well or get worse. °Document Released: 08/06/2007 Document Revised: 10/20/2012 Document Reviewed: 09/14/2012 °ExitCare® Patient Information ©2014 ExitCare, LLC. ° °

## 2013-05-20 ENCOUNTER — Ambulatory Visit (INDEPENDENT_AMBULATORY_CARE_PROVIDER_SITE_OTHER): Payer: 59 | Admitting: Family Medicine

## 2013-05-20 VITALS — BP 112/70 | HR 82 | Temp 98.7°F | Resp 16 | Ht 62.0 in | Wt 170.0 lb

## 2013-05-20 DIAGNOSIS — IMO0002 Reserved for concepts with insufficient information to code with codable children: Secondary | ICD-10-CM

## 2013-05-20 MED ORDER — DOXYCYCLINE HYCLATE 100 MG PO TABS: 100.0000 mg | ORAL_TABLET | Freq: Two times a day (BID) | ORAL | Status: DC

## 2013-05-20 NOTE — Patient Instructions (Addendum)
Take doxycycline for 7 days. Try to soak the finger in warm soapy water 2-3 times a day for the next few days. Follow up if pain worsens or you develop a fever or expanding redness or swelling of that finger. We will call you if we need to change your antibiotics.   Use back up birth control while on the antibiotic.   Paronychia Paronychia is an inflammatory reaction involving the folds of the skin surrounding the fingernail. This is commonly caused by an infection in the skin around a nail. The most common cause of paronychia is frequent wetting of the hands (as seen with bartenders, food servers, nurses or others who wet their hands). This makes the skin around the fingernail susceptible to infection by bacteria (germs) or fungus. Other predisposing factors are:  Aggressive manicuring.  Nail biting.  Thumb sucking. The most common cause is a staphylococcal (a type of germ) infection, or a fungal (Candida) infection. When caused by a germ, it usually comes on suddenly with redness, swelling, pus and is often painful. It may get under the nail and form an abscess (collection of pus), or form an abscess around the nail. If the nail itself is infected with a fungus, the treatment is usually prolonged and may require oral medicine for up to one year. Your caregiver will determine the length of time treatment is required. The paronychia caused by bacteria (germs) may largely be avoided by not pulling on hangnails or picking at cuticles. When the infection occurs at the tips of the finger it is called felon. When the cause of paronychia is from the herpes simplex virus (HSV) it is called herpetic whitlow. TREATMENT  When an abscess is present treatment is often incision and drainage. This means that the abscess must be cut open so the pus can get out. When this is done, the following home care instructions should be followed. HOME CARE INSTRUCTIONS   It is important to keep the affected fingers very dry  (when not doing soaks). Rubber or plastic gloves over cotton gloves should be used whenever the hand must be placed in water.  Keep wound clean, dry and dressed as suggested by your caregiver between warm soaks or warm compresses.  Soak in warm water for fifteen to twenty minutes three to four times per day for bacterial infections. Fungal infections are very difficult to treat, so often require treatment for long periods of time.  For bacterial (germ) infections take antibiotics (medicine which kill germs) as directed and finish the prescription, even if the problem appears to be solved before the medicine is gone.  Only take over-the-counter or prescription medicines for pain, discomfort, or fever as directed by your caregiver. SEEK IMMEDIATE MEDICAL CARE IF:  You have redness, swelling, or increasing pain in the wound.  You notice pus coming from the wound.  You have a fever.  You notice a bad smell coming from the wound or dressing. Document Released: 08/13/2000 Document Revised: 05/12/2011 Document Reviewed: 04/14/2008 [facility] Patient Information 2014 [place]Care, [place]LLC.

## 2013-05-20 NOTE — Progress Notes (Addendum)
[facility] [address], [place] [id number] [phone]  Date:  05/20/2013   Name:  Christine CableLanika N Harris   DOB:  [birth year]   MRN:  [id number]  PCP:  Lorretta HarpPANOSH,WANDA KOTVAN, MD    Chief Complaint: Hand Pain   History of Present Illness:  Christine Harris is a 28 y.o. very pleasant female patient who presents with the following:  Complains of right 3rd finger pain x 3 days. Noted some redness initially and over time finger continued to swell until she noted a small white area on the ulnar side of the nail bed. Pain slowly increased. Has tried OTC medicine with little relief including tylenol and ibuprofen. Has not tried warm soaks. Pain described as severe aching. Does not remember inciting event.   She is not currently on depo, but is not SA now and states there is no possibility of pregnancy.   Patient Active Problem List   Diagnosis Date Noted  . Other general counseling and advice for contraceptive management 03/23/2013  . Delayed menses 03/23/2013  . Vaginal discharge 03/23/2013  . Breast swelling 03/23/2013  . Recurrent boils 10/29/2011  . Amenorrhea 10/29/2011  . Body piercing 10/29/2011  . Contraception management 10/29/2011  . Bacterial vaginosis 06/11/2011    Past Medical History  Diagnosis Date  . Bacterial vaginosis 12/05/2010  . H/O varicella   . H/O: varicose veins   . Yeast infection   . Abscess     surgery consult 2003    Past Surgical History  Procedure Laterality Date  . Tonsillectomy  2010  . Hernia repair  age 89    History  Substance Use Topics  . Smoking status: Never Smoker   . Smokeless tobacco: Never Used  . Alcohol Use: Yes    Family History  Problem Relation Age of Onset  . Diabetes Maternal Grandmother   . Colon cancer Maternal Grandmother   . Hyperlipidemia Maternal Grandmother   . Heart disease Maternal Grandmother   . Cancer Maternal Grandmother   . Diabetes Maternal Grandfather   . Asthma Paternal  Grandmother   . Heart disease Paternal Grandmother   . Hyperlipidemia Paternal Grandmother   . Stroke Paternal Grandmother     Allergies  Allergen Reactions  . Codeine     REACTION: itching    Medication list has been reviewed and updated.  No current outpatient prescriptions on file prior to visit.   No current facility-administered medications on file prior to visit.    Review of Systems:  No fever, chills, nausea, vomiting  Physical Examination: Filed Vitals:   05/20/13 1636  BP: 112/70  Pulse: 82  Temp: 98.7 F (37.1 C)  Resp: 16   Filed Vitals:   05/20/13 1636  Height: 5\' 2"  (1.575 m)  Weight: 170 lb (77.111 kg)   Body mass index is 31.09 kg/(m^2). Ideal Body Weight: Weight in (lb) to have BMI = 25: 136.4  Gen: anxious appearing but NAD CV: RRR no mrg Lungs: CTAB  MSK: 5/5 strength in bilateral hands with exception of refusal to bend 3rd finger due to pain. Right third finger past DIP is erythematous and swollen about 2mm wider on each side compared to left hand. Small white area noted on nail bed on ulnar side.   Procedure:  Incision and drainage of paronychia Risks, benefits, and alternatives explained and verbal consent obtained. Surface betadine and further cleansed with alcohol. #11 blade used to make a stab incision into abscess. Pus expressed  with pressure and then cleansed. Culture obtained.  Further purulence expressed.  Area covered with mupirocin and bandaid.  Pt stable. Aftercare and follow-up advised.   Assessment and Plan:  Acute Paronychia of right 3rd finger We completed I+D in office. 7 days of doxycycline prescribed with advisement to use back up birth control method (though states not active and not using birth control). Advised continued warm soaks for about 2 days. Reasons for return discussed. Will call if need to alter antibiotics based off of culture results.   Dr. Patsy Lager has seen and evaluated the patient. We have discussed  the history, exam, assessment, and plan as noted above. She agrees with management.   Aldine Contes. Marti Sleigh, MD, PGY3 05/20/2013 5:14 PM ___________________________________________________________________________________________________    Signed Abbe Amsterdam, MD  3/23: noted her culture showed MRSA, resistant to doxy but sensitive to septra.  Called in rx for septra DS BID for 10 days.  Called and LMOM at her home and cell numbers; if doing well she does not necessarily need to change abx.  If any questions give me a call  Results for orders placed in visit on 05/20/13  WOUND CULTURE      Result Value Ref Range   Culture       Value: Abundant METHICILLIN RESISTANT STAPHYLOCOCCUS AUREUS   Gram Stain Rare     Gram Stain WBC present-predominately Mononuclear     Gram Stain Rare Squamous Epithelial Cells Present     Gram Stain Few GRAM POSITIVE COCCI IN PAIRS     Gram Stain Rare Gram Negative Rods     Organism ID, Bacteria METHICILLIN RESISTANT STAPHYLOCOCCUS AUREUS

## 2013-05-23 LAB — WOUND CULTURE

## 2013-05-23 MED ORDER — SULFAMETHOXAZOLE-TMP DS 800-160 MG PO TABS: 1.0000 | ORAL_TABLET | Freq: Two times a day (BID) | ORAL | Status: DC

## 2013-05-23 NOTE — Addendum Note (Signed)
Addended by: Abbe AmsterdamOPLAND, [place] Dowd C on: 05/23/2013 06:03 PM   Modules accepted: Orders

## 2013-05-31 ENCOUNTER — Other Ambulatory Visit: Payer: BC Managed Care – PPO

## 2013-06-07 ENCOUNTER — Ambulatory Visit (INDEPENDENT_AMBULATORY_CARE_PROVIDER_SITE_OTHER): Payer: 59 | Admitting: Internal Medicine

## 2013-06-07 ENCOUNTER — Encounter: Payer: Self-pay | Admitting: Internal Medicine

## 2013-06-07 VITALS — BP 100/64 | HR 66 | Temp 98.6°F | Ht 62.75 in | Wt 169.0 lb

## 2013-06-07 DIAGNOSIS — R519 Headache, unspecified: Secondary | ICD-10-CM

## 2013-06-07 DIAGNOSIS — Z0000 Encounter for general adult medical examination without abnormal findings: Secondary | ICD-10-CM

## 2013-06-07 DIAGNOSIS — Z30013 Encounter for initial prescription of injectable contraceptive: Secondary | ICD-10-CM

## 2013-06-07 DIAGNOSIS — R51 Headache: Secondary | ICD-10-CM

## 2013-06-07 DIAGNOSIS — Z3009 Encounter for other general counseling and advice on contraception: Secondary | ICD-10-CM

## 2013-06-07 DIAGNOSIS — Z113 Encounter for screening for infections with a predominantly sexual mode of transmission: Secondary | ICD-10-CM

## 2013-06-07 DIAGNOSIS — Z309 Encounter for contraceptive management, unspecified: Secondary | ICD-10-CM

## 2013-06-07 LAB — CBC WITH DIFFERENTIAL/PLATELET
BASOS ABS: 0 10*3/uL (ref 0.0–0.1)
Basophils Relative: 0.4 % (ref 0.0–3.0)
EOS ABS: 0.1 10*3/uL (ref 0.0–0.7)
Eosinophils Relative: 1.4 % (ref 0.0–5.0)
HEMATOCRIT: 36.6 % (ref 36.0–46.0)
HEMOGLOBIN: 12.3 g/dL (ref 12.0–15.0)
LYMPHS ABS: 1.5 10*3/uL (ref 0.7–4.0)
Lymphocytes Relative: 36.4 % (ref 12.0–46.0)
MCHC: 33.7 g/dL (ref 30.0–36.0)
MCV: 93.2 fl (ref 78.0–100.0)
MONO ABS: 0.3 10*3/uL (ref 0.1–1.0)
Monocytes Relative: 7.4 % (ref 3.0–12.0)
NEUTROS ABS: 2.2 10*3/uL (ref 1.4–7.7)
Neutrophils Relative %: 54.4 % (ref 43.0–77.0)
Platelets: 302 10*3/uL (ref 150.0–400.0)
RBC: 3.92 Mil/uL (ref 3.87–5.11)
RDW: 12.9 % (ref 11.5–14.6)
WBC: 4.1 10*3/uL — ABNORMAL LOW (ref 4.5–10.5)

## 2013-06-07 LAB — HEPATIC FUNCTION PANEL
ALBUMIN: 4.2 g/dL (ref 3.5–5.2)
ALT: 11 U/L (ref 0–35)
AST: 15 U/L (ref 0–37)
Alkaline Phosphatase: 39 U/L (ref 39–117)
Bilirubin, Direct: 0 mg/dL (ref 0.0–0.3)
Total Bilirubin: 0.5 mg/dL (ref 0.3–1.2)
Total Protein: 7.8 g/dL (ref 6.0–8.3)

## 2013-06-07 LAB — BASIC METABOLIC PANEL
BUN: 16 mg/dL (ref 6–23)
CO2: 29 mEq/L (ref 19–32)
Calcium: 9.4 mg/dL (ref 8.4–10.5)
Chloride: 103 mEq/L (ref 96–112)
Creatinine, Ser: 0.6 mg/dL (ref 0.4–1.2)
GFR: 151.9 mL/min (ref >60.00)
GLUCOSE: 87 mg/dL (ref 70–99)
POTASSIUM: 3.7 meq/L (ref 3.5–5.1)
SODIUM: 138 meq/L (ref 135–145)

## 2013-06-07 LAB — LIPID PANEL
Cholesterol: 147 mg/dL (ref 0–200)
HDL: 54.3 mg/dL (ref >39.00)
LDL Cholesterol: 83 mg/dL (ref 0–99)
TRIGLYCERIDES: 49 mg/dL (ref 0.0–149.0)
Total CHOL/HDL Ratio: 3
VLDL: 9.8 mg/dL (ref 0.0–40.0)

## 2013-06-07 LAB — TSH: TSH: 0.94 u[IU]/mL (ref 0.35–5.50)

## 2013-06-07 LAB — POCT URINE PREGNANCY: Preg Test, Ur: NEGATIVE

## 2013-06-07 MED ORDER — MEDROXYPROGESTERONE ACETATE 150 MG/ML IM SUSP
150.0000 mg | Freq: Once | INTRAMUSCULAR | Status: AC
  Administered 2013-06-07: 150 mg via INTRAMUSCULAR

## 2013-06-07 NOTE — Patient Instructions (Signed)
lifestyle intervention healthy eating and exercise . 150 minutes of exercise weeks  ,  Lose weight  To healthy levels. Avoid trans fats and processed foods;  Increase fresh fruits and veges to 5 servings per day. And avoid sweet beverages  Including tea and juice. Avoid tobacco . Will notify you  of labs when available. Begin depo injection  And every 12 weeks  If head pain getting worse contact us for  further advice

## 2013-06-07 NOTE — Progress Notes (Signed)
Chief Complaint  Patient presents with  . Annual Exam    Would like to restart Depo Provera.    HPI: Patient comes in today for Preventive Health Care visit  Did well on depo in past . Would like to restart dep  Was off cause had no insurance when lost job now working [employer] days  And school 4 hours to finish in 2017   Health Maintenance  Topic Date Due  . Tetanus/tdap  12/11/2005  . Influenza Vaccine  10/01/2013  . Pap Smear  03/22/2016   Health Maintenance Review ROS: ROS: Gets ocass Sharp pain throbs  Back of right head  Shooting  Pain  When cough or  Laughing now .  2 minutes   Cough related headaches .  No assoc sx  No trauma or nmeuro findings   Mom has hx of has    GEN/ HEENT: No fever, significant weight changes sweats  vision problems hearing changes, CV/ PULM; No chest pain shortness of breath cough, syncope,edema  change in exercise tolerance. GI /GU: No adominal pain, vomiting, change in bowel habits. No blood in the stool. No significant GU symptoms. SKIN/HEME: ,no acute skin rashes suspicious lesions or bleeding. No lymphadenopathy, nodules, masses.  NEURO/ PSYCH:  No neurologic signs such as weakness numbness. No depression anxiety. IMM/ Allergy: No unusual infections.  Allergy .   REST of 12 system review negative except as per HPI   Past Medical History  Diagnosis Date  . Bacterial vaginosis 12/05/2010  . H/O varicella   . H/O: varicose veins   . Yeast infection   . Abscess     surgery consult 2003  . Delayed menses 03/23/2013    6 week tab dec 5th .      Family History  Problem Relation Age of Onset  . Diabetes Maternal Grandmother   . Colon cancer Maternal Grandmother   . Hyperlipidemia Maternal Grandmother   . Heart disease Maternal Grandmother   . Cancer Maternal Grandmother   . Diabetes Maternal Grandfather   . Asthma Paternal Grandmother   . Heart disease Paternal Grandmother   . Hyperlipidemia Paternal Grandmother   . Stroke Paternal  Grandmother     History   Social History  . Marital Status: Single    Spouse Name: N/A    Number of Children: N/A  . Years of Education: N/A   Social History Main Topics  . Smoking status: Never Smoker   . Smokeless tobacco: Never Used  . Alcohol Use: Yes  . Drug Use: No  . Sexual Activity: Yes    Birth Control/ Protection: Condom, Injection   Other Topics Concern  . None   Social History Narrative   Living at home   hh of 3       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 Weenie dog.    2 cokes per week.     Outpatient Encounter Prescriptions as of 06/07/2013  Medication Sig  . [DISCONTINUED] doxycycline (VIBRA-TABS) 100 MG tablet Take 1 tablet (100 mg total) by mouth 2 (two) times daily.  . [DISCONTINUED] sulfamethoxazole-trimethoprim (BACTRIM DS) 800-160 MG per tablet Take 1 tablet by mouth 2 (two) times daily.  . [EXPIRED] medroxyPROGESTERone (DEPO-PROVERA) injection 150 mg     EXAM:  BP 100/64  Pulse 66  Temp(Src) 98.6 F (37 C) (Oral)  Ht 5' 2.75" (1.594 m)  Wt 169 lb (76.658 kg)  BMI 30.17 kg/m2  SpO2 98%  Body mass index is 30.17 kg/(m^2).  Physical Exam: Vital signs reviewed ZOX:WRUEGEN:This is a well-developed well-nourished alert cooperative   Female  who appearsr stated age in no acute distress.  HEENT: normocephalic atraumatic , Eyes: PERRL EOM's full, conjunctiva clear, Nares: paten,t no deformity discharge or tenderness., Ears: no deformity EAC's clear TMs with normal landmarks. Mouth: clear OP, no lesions, edema.  Moist mucous membranes. Pierced tongue Dentition in adequate repair. NECK: supple without masses, thyromegaly or bruits. CHEST/PULM:  Clear to auscultation and percussion breath sounds equal no wheeze , rales or rhonchi. No chest wall deformities or tenderness. Breast: normal by inspection . No dimpling, discharge, masses, tenderness or discharge . Pierced aereolae CV: PMI is nondisplaced, S1 S2 no  gallops, murmurs, rubs. Peripheral pulses are full without delay.No JVD .  ABDOMEN: Bowel sounds normal nontender  No guard or rebound, no hepato splenomegal no CVA tenderness.  No hernia. Extremtities:  No clubbing cyanosis or edema, no acute joint swelling or redness no focal atrophy NEURO:  Oriented x3, cranial nerves 3-12 appear to be intact, no obvious focal weakness,gait within normal limits no abnormal reflexes or asymmetrical SKIN: No acute rashes normal turgor, color, no bruising or petechiae. tatoos back  X 2  PSYCH: Oriented, good eye contact, no obvious depression anxiety, cognition and judgment appear normal. LN: no cervical axillary inguinal adenopathy  ASSESSMENT AND PLAN:  Discussed the following assessment and plan:  Visit for preventive health examination - uncertain tdap  may be utd - Plan: Basic metabolic panel, CBC with Differential, Hepatic function panel, Lipid panel, TSH, GC/chlamydia probe amp, urine, RPR, HIV antibody, POCT urine pregnancy  Contraceptive management - Plan: Basic metabolic panel, CBC with Differential, Hepatic function panel, Lipid panel, TSH, GC/chlamydia probe amp, urine, RPR, HIV antibody, POCT urine pregnancy, medroxyPROGESTERone (DEPO-PROVERA) injection 150 mg  Routine screening for STI (sexually transmitted infection) - Plan: Basic metabolic panel, CBC with Differential, Hepatic function panel, Lipid panel, TSH, GC/chlamydia probe amp, urine, RPR, HIV antibody, POCT urine pregnancy  Head pain - Plan: Basic metabolic panel, CBC with Differential, Hepatic function panel, Lipid panel, TSH, GC/chlamydia probe amp, urine, RPR, HIV antibody, POCT urine pregnancy  Initiation of Depo Provera  Patient Care Team: Madelin HeadingsWanda K Panosh, MD as PCP - General Hal MoralesVanessa P Haygood, MD (Obstetrics and Gynecology) Patient Instructions  lifestyle intervention healthy eating and exercise . 150 minutes of exercise weeks  ,  Lose weight  To healthy levels. Avoid trans fats  and processed foods;  Increase fresh fruits and veges to 5 servings per day. And avoid sweet beverages  Including tea and juice. Avoid tobacco . Will notify you  of labs when available. Begin depo injection  And every 12 weeks  If head pain getting worse contact us for  further advice     Burna MortimerWanda K. Panosh M.D.  Pre visit review using our clinic review tool, if applicable. No additional management support is needed unless otherwise documented below in the visit note.

## 2013-06-08 LAB — GC/CHLAMYDIA PROBE AMP, URINE
Chlamydia, Swab/Urine, PCR: NEGATIVE
GC Probe Amp, Urine: NEGATIVE

## 2013-06-08 LAB — RPR

## 2013-06-08 LAB — HIV ANTIBODY (ROUTINE TESTING W REFLEX): HIV 1&2 Ab, 4th Generation: NONREACTIVE

## 2013-09-06 ENCOUNTER — Ambulatory Visit (INDEPENDENT_AMBULATORY_CARE_PROVIDER_SITE_OTHER): Payer: 59 | Admitting: Family Medicine

## 2013-09-06 DIAGNOSIS — Z309 Encounter for contraceptive management, unspecified: Secondary | ICD-10-CM

## 2013-09-06 MED ORDER — MEDROXYPROGESTERONE ACETATE 150 MG/ML IM SUSP
150.0000 mg | Freq: Once | INTRAMUSCULAR | Status: AC
  Administered 2013-09-06: 150 mg via INTRAMUSCULAR

## 2013-12-12 ENCOUNTER — Ambulatory Visit (INDEPENDENT_AMBULATORY_CARE_PROVIDER_SITE_OTHER): Payer: 59 | Admitting: Family Medicine

## 2013-12-12 DIAGNOSIS — Z3042 Encounter for surveillance of injectable contraceptive: Secondary | ICD-10-CM

## 2013-12-12 LAB — POCT URINE PREGNANCY: Preg Test, Ur: NEGATIVE

## 2013-12-12 MED ORDER — MEDROXYPROGESTERONE ACETATE 150 MG/ML IM SUSP
150.0000 mg | Freq: Once | INTRAMUSCULAR | Status: AC
  Administered 2013-12-12: 150 mg via INTRAMUSCULAR

## 2014-01-02 ENCOUNTER — Encounter: Payer: Self-pay | Admitting: Internal Medicine

## 2014-07-11 ENCOUNTER — Ambulatory Visit (INDEPENDENT_AMBULATORY_CARE_PROVIDER_SITE_OTHER): Payer: 59 | Admitting: Internal Medicine

## 2014-07-11 ENCOUNTER — Encounter: Payer: Self-pay | Admitting: Internal Medicine

## 2014-07-11 VITALS — BP 110/64 | Temp 99.0°F | Ht 63.0 in | Wt 184.2 lb

## 2014-07-11 DIAGNOSIS — N912 Amenorrhea, unspecified: Secondary | ICD-10-CM | POA: Diagnosis not present

## 2014-07-11 DIAGNOSIS — N6489 Other specified disorders of breast: Secondary | ICD-10-CM

## 2014-07-11 DIAGNOSIS — N62 Hypertrophy of breast: Secondary | ICD-10-CM | POA: Diagnosis not present

## 2014-07-11 DIAGNOSIS — IMO0002 Reserved for concepts with insufficient information to code with codable children: Secondary | ICD-10-CM

## 2014-07-11 LAB — POCT URINE PREGNANCY: Preg Test, Ur: NEGATIVE

## 2014-07-11 NOTE — Patient Instructions (Addendum)
Many women have asynmterty in their breasts  Usually as a teen ager and young adult.  Because you didn't have this until this past year and you think it is changing  And uncomfortable   We will arrange a consult with surgeon who sees breast diseases. I don't feel a lump of mass to explain the problem but would get a second opinion. Sometimes infection can cause discomfort ans swelling but  Pattern of seems prolonged l for this  You lack of periods is prob from the depoprovera  . If periods not coming back  In another 3 months then advise see your gyne.about this.

## 2014-07-11 NOTE — Progress Notes (Signed)
Pre visit review using our clinic review tool, if applicable. No additional management support is needed unless otherwise documented below in the visit note.   Chief Complaint  Patient presents with  . Left Breast Swelling    Comlains of a heavy feeling with some pain in her left breast.  Left breast is larger than the rt.    HPI: Patient Christine Harris  comes in today for SDA for  new problem evaluation. ONSET ABOUT A YEAR AGO and then getting worse after gaining weight and still bigger after not gaining weight  Left breast feels heavy has had to inc bra size once and now again  stop the depo in October  november . No periods since  Condoms.  Reg pre depo .   No preg test.  No mass flet no hx of same when younger  ROS: See pertinent positives and negatives per HPI. No galactorrhea   Vision heache fever or rednessHas pierced nipples rings not in now.   Past Medical History  Diagnosis Date  . Bacterial vaginosis 12/05/2010  . H/O varicella   . H/O: varicose veins   . Yeast infection   . Abscess     surgery consult 2003  . Delayed menses 03/23/2013    6 week tab dec 5th .      Family History  Problem Relation Age of Onset  . Diabetes Maternal Grandmother   . Colon cancer Maternal Grandmother   . Hyperlipidemia Maternal Grandmother   . Heart disease Maternal Grandmother   . Cancer Maternal Grandmother   . Diabetes Maternal Grandfather   . Asthma Paternal Grandmother   . Heart disease Paternal Grandmother   . Hyperlipidemia Paternal Grandmother   . Stroke Paternal Grandmother     History   Social History  . Marital Status: Single    Spouse Name: N/A  . Number of Children: N/A  . Years of Education: N/A   Social History Main Topics  . Smoking status: Never Smoker   . Smokeless tobacco: Never Used  . Alcohol Use: Yes  . Drug Use: No  . Sexual Activity: Yes    Birth Control/ Protection: Condom, Injection   Other Topics Concern  . None   Social History Narrative    Living at home   hh of 3       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 [employer]ie dog.    2 cokes per week.     No outpatient prescriptions prior to visit.   No facility-administered medications prior to visit.     EXAM:  BP 110/64 mmHg  Temp(Src) 99 F (37.2 C) (Oral)  Ht 5\' 3"  (1.6 m)  Wt 184 lb 3.2 oz (83.553 kg)  BMI 32.64 kg/m2  Body mass index is 32.64 kg/(m^2).  GENERAL: vitals reviewed and listed above, alert, oriented, appears well hydrated and in no acute distress HEENT: atraumatic, conjunctiva  clear, no obvious abnormalities on inspection of external nose and ears NECK: no obvious masses on inspection palpation  Breast pnedulous  Left obvipously lareger than right but no mass  Noted  Exam axill a clear no tenderspot  No obv masses consistency seems similar  No redness  ? If tenderness  MS: moves all extremities without noticeable focal  abnormality PSYCH: pleasant and cooperative, no obvious depression or anxiety  ASSESSMENT AND PLAN:  Discussed the following assessment and plan:  Breast enlargement left  -  left newonset growing over a year and continueing  no obvious mass  not a problem in adolescent no discharge - Plan: Ambulatory referral to General Surgery  Breast asymmetry new onset  - late onset for typical developmental  - Plan: Ambulatory referral to General Surgery  Amenorrhea - from the depo neg [facility]  last depo  october 15 - Plan: POCT urine pregnancy  -Patient advised to return or notify health care team  if symptoms worsen ,persist or new concerns arise.  Patient Instructions  Many women have asynmterty in their breasts  Usually as a teen ager and young adult.  Because you didn't have this until this past year and you think it is changing  And uncomfortable   We will arrange a consult with surgeon who sees breast diseases. I don't feel a lump of mass to explain the problem but would get a second  opinion. Sometimes infection can cause discomfort ans swelling but  Pattern of seems prolonged l for this  You lack of periods is prob from the depoprovera  . If periods not coming back  In another 3 months then advise see your gyne.about this.      Christine Harris M.D.

## 2014-07-12 ENCOUNTER — Telehealth: Payer: Self-pay | Admitting: Internal Medicine

## 2014-07-12 DIAGNOSIS — IMO0002 Reserved for concepts with insufficient information to code with codable children: Secondary | ICD-10-CM

## 2014-07-12 NOTE — Telephone Encounter (Signed)
Pt called and [facility] . [facility] is requesting a referral

## 2014-07-13 NOTE — Telephone Encounter (Signed)
Pt has not been seen at [facility] yet. Pt states that she received a call from them saying she should get the mammogram first. I am unsure if I should order just the left breast diagnostic or bilateral.

## 2014-07-14 NOTE — Telephone Encounter (Signed)
Just the left

## 2014-07-14 NOTE — Telephone Encounter (Signed)
Order placed in the system. Pt notified. 

## 2014-07-19 ENCOUNTER — Other Ambulatory Visit: Payer: Self-pay

## 2014-07-19 ENCOUNTER — Telehealth: Payer: Self-pay | Admitting: Internal Medicine

## 2014-07-19 DIAGNOSIS — N6489 Other specified disorders of breast: Secondary | ICD-10-CM

## 2014-07-19 DIAGNOSIS — IMO0002 Reserved for concepts with insufficient information to code with codable children: Secondary | ICD-10-CM

## 2014-07-19 NOTE — Telephone Encounter (Signed)
Still has not been called to set up a mammogram by the [facility]. Per the breast center the order needs to be changed to an ultrasound of the left breast, limited and then they can call patient to schedule.

## 2014-07-19 NOTE — Telephone Encounter (Signed)
Appointment change to appropriate order

## 2014-07-21 ENCOUNTER — Ambulatory Visit
Admission: RE | Admit: 2014-07-21 | Discharge: 2014-07-21 | Disposition: A | Discharge status: Home / Self Care | Payer: 59 | Source: Ambulatory Visit | Attending: Internal Medicine | Admitting: Internal Medicine

## 2014-07-21 DIAGNOSIS — N6489 Other specified disorders of breast: Secondary | ICD-10-CM

## 2014-07-21 DIAGNOSIS — IMO0002 Reserved for concepts with insufficient information to code with codable children: Secondary | ICD-10-CM

## 2014-08-16 ENCOUNTER — Ambulatory Visit (INDEPENDENT_AMBULATORY_CARE_PROVIDER_SITE_OTHER): Payer: 59 | Admitting: Family Medicine

## 2014-08-16 VITALS — BP 100/60 | HR 82 | Temp 99.3°F | Resp 18 | Ht 62.0 in | Wt 185.0 lb

## 2014-08-16 DIAGNOSIS — K921 Melena: Secondary | ICD-10-CM | POA: Diagnosis not present

## 2014-08-16 DIAGNOSIS — K602 Anal fissure, unspecified: Secondary | ICD-10-CM | POA: Diagnosis not present

## 2014-08-16 MED ORDER — HYDROCORTISONE 2.5 % RE CREA: 1.0000 "application " | TOPICAL_CREAM | Freq: Two times a day (BID) | RECTAL | Status: DC

## 2014-08-16 MED ORDER — DOCUSATE SODIUM 100 MG PO CAPS: 100.0000 mg | ORAL_CAPSULE | Freq: Two times a day (BID) | ORAL | Status: DC

## 2014-08-16 NOTE — Patient Instructions (Signed)
There is a small tear in the anal area. This usually heal spontaneously in a couple days. It's important to soften the stools with a Colace which are called in, and the Anusol cream which helps the skin heal. Make sure you rinse well after shower. Just be on the safe side, I've asked to get a specialist consultation  Anal Fissure, Adult An anal fissure is a small tear or crack in the skin around the anus. Bleeding from a fissure usually stops on its own within a few minutes. However, bleeding will often reoccur with each bowel movement until the crack heals.  CAUSES   Passing large, hard stools.  Frequent diarrheal stools.  Constipation.  Inflammatory bowel disease (Crohn's disease or ulcerative colitis).  Infections.  Anal sex. SYMPTOMS   Small amounts of blood seen on your stools, on toilet paper, or in the toilet after a bowel movement.  Rectal bleeding.  Painful bowel movements.  Itching or irritation around the anus. DIAGNOSIS Your caregiver will examine the anal area. An anal fissure can usually be seen with careful inspection. A rectal exam may be performed and a short tube (anoscope) may be used to examine the anal canal. TREATMENT   You may be instructed to take fiber supplements. These supplements can soften your stool to help make bowel movements easier.  Sitz baths may be recommended to help heal the tear. Do not use soap in the sitz baths.  A medicated cream or ointment may be prescribed to lessen discomfort. HOME CARE INSTRUCTIONS   Maintain a diet high in fruits, whole grains, and vegetables. Avoid constipating foods like bananas and dairy products.  Take sitz baths as directed by your caregiver.  Drink enough fluids to keep your urine clear or pale yellow.  Only take over-the-counter or prescription medicines for pain, discomfort, or fever as directed by your caregiver. Do not take aspirin as this may increase bleeding.  Do not use ointments containing  numbing medications (anesthetics) or hydrocortisone. They could slow healing. SEEK MEDICAL CARE IF:   Your fissure is not completely healed within 3 days.  You have further bleeding.  You have a fever.  You have diarrhea mixed with blood.  You have pain.  Your problem is getting worse rather than better. MAKE SURE YOU:   Understand these instructions.  Will watch your condition.  Will get help right away if you are not doing well or get worse. Document Released: 02/17/2005 Document Revised: 05/12/2011 Document Reviewed: 08/04/2010 [facility] Patient Information 2015 [place], [place]. This information is not intended to replace advice given to you by your health care provider. Make sure you discuss any questions you have with your health care provider.

## 2014-08-16 NOTE — Progress Notes (Signed)
This is a 29 year old woman who works for [employer]. She had blood per rectum after a bowel movement today which was described as somewhat maroon and bright red. It happened again with the second bowel movement later in the day.  Patient's had no abdominal pain.  Patient has a grandmother with colon cancer.  Objective: Patient's in no acute distress BP 100/60 mmHg  Pulse 82  Temp(Src) 99.3 F (37.4 C) (Oral)  Resp 18  Ht 5\' 2"  (1.575 m)  Wt 185 lb (83.915 kg)  BMI 33.83 kg/m2  SpO2 98% Abdominal exam is benign with no HSM, tenderness or mass Rectal exam reveals a small fissure at 6:00 in the left lateral decubitus position.  Assessment: Anal fissure is most likely explanation for this. Nevertheless with some family history of it's important to get this evaluation more complete  Plan: Anusol HC, Colace, GI consult  Signed, Sheila Oats.D.

## 2015-01-12 ENCOUNTER — Encounter (HOSPITAL_COMMUNITY): Payer: Self-pay | Admitting: Emergency Medicine

## 2015-01-12 ENCOUNTER — Emergency Department (HOSPITAL_COMMUNITY)
Admission: EM | Admit: 2015-01-12 | Discharge: 2015-01-13 | Disposition: A | Discharge status: Home / Self Care | Payer: 59 | Attending: Emergency Medicine | Admitting: Emergency Medicine

## 2015-01-12 DIAGNOSIS — J3489 Other specified disorders of nose and nasal sinuses: Secondary | ICD-10-CM | POA: Diagnosis not present

## 2015-01-12 DIAGNOSIS — R51 Headache: Secondary | ICD-10-CM | POA: Insufficient documentation

## 2015-01-12 DIAGNOSIS — G8929 Other chronic pain: Secondary | ICD-10-CM | POA: Diagnosis not present

## 2015-01-12 DIAGNOSIS — Z79899 Other long term (current) drug therapy: Secondary | ICD-10-CM | POA: Insufficient documentation

## 2015-01-12 DIAGNOSIS — R519 Headache, unspecified: Secondary | ICD-10-CM

## 2015-01-12 DIAGNOSIS — Z8742 Personal history of other diseases of the female genital tract: Secondary | ICD-10-CM | POA: Insufficient documentation

## 2015-01-12 DIAGNOSIS — Z872 Personal history of diseases of the skin and subcutaneous tissue: Secondary | ICD-10-CM | POA: Insufficient documentation

## 2015-01-12 DIAGNOSIS — Z8619 Personal history of other infectious and parasitic diseases: Secondary | ICD-10-CM | POA: Diagnosis not present

## 2015-01-12 NOTE — ED Notes (Signed)
Pt from home c/o sharp pains in the back of her when she laugh or coughs x months. She reports some nausea but no vomiting

## 2015-01-13 ENCOUNTER — Emergency Department (HOSPITAL_COMMUNITY): Payer: 59

## 2015-01-13 MED ORDER — ACETAMINOPHEN 500 MG PO TABS
1000.0000 mg | ORAL_TABLET | Freq: Once | ORAL | Status: AC
  Administered 2015-01-13: 1000 mg via ORAL
  Filled 2015-01-13: qty 2

## 2015-01-13 MED ORDER — NAPROXEN 250 MG PO TABS: 250.0000 mg | ORAL_TABLET | Freq: Two times a day (BID) | ORAL | Status: DC | PRN

## 2015-01-13 MED ORDER — IBUPROFEN 200 MG PO TABS
400.0000 mg | ORAL_TABLET | Freq: Once | ORAL | Status: AC
  Administered 2015-01-13: 400 mg via ORAL
  Filled 2015-01-13: qty 2

## 2015-01-13 MED ORDER — METHOCARBAMOL 500 MG PO TABS: 1000.0000 mg | ORAL_TABLET | Freq: Four times a day (QID) | ORAL | Status: DC | PRN

## 2015-01-13 NOTE — ED Provider Notes (Signed)
CSN: [id number]     Arrival date & time 01/12/15  2308 History   First MD Initiated Contact with Patient 01/13/15 0118     Chief Complaint  Patient presents with  . Headache      HPI Pt was seen at 0140. Per pt, c/o gradual onset and persistence of acute flair if her chronic occipital "headache" for "many many months."  Denies any change in her headache pattern over the "many many months" it has been present. Describes the headache as "throbbing." States she occasionally takes motrin for the discomfort with relief.  Denies headache was sudden or maximal in onset or at any time.  Denies visual changes, no focal motor weakness, no tingling/numbness in extremities, no fevers, no neck pain, no rash.      Past Medical History  Diagnosis Date  . Bacterial vaginosis 12/05/2010  . H/O varicella   . H/O: varicose veins   . Yeast infection   . Abscess     surgery consult 2003  . Delayed menses 03/23/2013    6 week tab dec 5th .     Past Surgical History  Procedure Laterality Date  . Tonsillectomy  2010  . Hernia repair  age 89   Family History  Problem Relation Age of Onset  . Diabetes Maternal Grandmother   . Colon cancer Maternal Grandmother   . Hyperlipidemia Maternal Grandmother   . Heart disease Maternal Grandmother   . Cancer Maternal Grandmother   . Diabetes Maternal Grandfather   . Asthma Paternal Grandmother   . Heart disease Paternal Grandmother   . Hyperlipidemia Paternal Grandmother   . Stroke Paternal Grandmother    Social History  Substance Use Topics  . Smoking status: Never Smoker   . Smokeless tobacco: Never Used  . Alcohol Use: 0.0 oz/week    0 Standard drinks or equivalent per week   OB History    Gravida Para Term Preterm AB TAB SAB Ectopic Multiple Living   2    2 1 1    0      Obstetric Comments   Tab Feb 04 2013 failed condoms  About ? 6 weeks     Review of Systems ROS: Statement: All systems negative except as marked or noted in the HPI;  Constitutional: Negative for fever and chills. ; ; Eyes: Negative for eye pain, redness and discharge. ; ; ENMT: Negative for ear pain, hoarseness, nasal congestion, sinus pressure and sore throat. ; ; Cardiovascular: Negative for chest pain, palpitations, diaphoresis, dyspnea and peripheral edema. ; ; Respiratory: Negative for cough, wheezing and stridor. ; ; Gastrointestinal: Negative for nausea, vomiting, diarrhea, abdominal pain, blood in stool, hematemesis, jaundice and rectal bleeding. . ; ; Genitourinary: Negative for dysuria, flank pain and hematuria. ; ; Musculoskeletal: Negative for back pain. Negative for swelling and trauma.; ; Skin: Negative for pruritus, rash, abrasions, blisters, bruising and skin lesion.; ; Neuro: +headache. Negative for lightheadedness and neck stiffness. Negative for weakness, altered level of consciousness , altered mental status, extremity weakness, paresthesias, involuntary movement, seizure and syncope.      Allergies  Codeine  Home Medications   Prior to Admission medications   Medication Sig Start Date End Date Taking? Authorizing Provider  metroNIDAZOLE (FLAGYL) 500 MG tablet Take 500 mg by mouth 2 (two) times daily.   Yes Historical Provider, MD  docusate sodium (COLACE) 100 MG capsule Take 1 capsule (100 mg total) by mouth 2 (two) times daily. Patient not taking: Reported on 01/12/2015 08/16/14  Elvina Sidle, MD  hydrocortisone (ANUSOL-HC) 2.5 % rectal cream Place 1 application rectally 2 (two) times daily. Patient not taking: Reported on 01/12/2015 08/16/14   Elvina Sidle, MD   BP 111/73 mmHg  Pulse 71  Temp(Src) 98.5 F (36.9 C) (Oral)  Resp 16  Ht  (1.575 m)  Wt 175 lb (79.379 kg)  BMI 32.00 kg/m2  SpO2 100%  LMP 01/02/2015 Physical Exam  0145: Physical examination:  Nursing notes reviewed; Vital signs and O2 SAT reviewed;  Constitutional: Well developed, Well nourished, Well hydrated, In no acute distress; Head:  Normocephalic,  atraumatic; Eyes: EOMI, PERRL, No scleral icterus; ENMT: TM's clear bilat. +edemetous nasal turbinates bilat with clear rhinorrhea. Mouth and pharynx normal, Mucous membranes moist; Neck: Supple, Full range of motion, No lymphadenopathy; Cardiovascular: Regular rate and rhythm, No murmur, rub, or gallop; Respiratory: Breath sounds clear & equal bilaterally, No rales, rhonchi, wheezes.  Speaking full sentences with ease, Normal respiratory effort/excursion; Chest: Nontender, Movement normal; Abdomen: Soft, Nontender, Nondistended, Normal bowel sounds; Genitourinary: No CVA tenderness; Spine:  No midline CS, TS, LS tenderness. +TTP bilat cervical paraspinal muscles. No rash.;; Extremities: Pulses normal, No tenderness, No edema, No calf edema or asymmetry.; Neuro: AA&Ox3, Major CN grossly intact. No facial droop. Speech clear. No gross focal motor or sensory deficits in extremities. Climbs on and off stretcher easily by herself. Gait steady.; Skin: Color normal, Warm, Dry.   ED Course  Procedures (including critical care time) Labs Review   Imaging Review  I have personally reviewed and evaluated these images and lab results as part of my medical decision-making.   EKG Interpretation None      MDM  MDM Reviewed: nursing note and vitals Interpretation: CT scan      Ct Head Wo Contrast 01/13/2015  CLINICAL DATA:  Sharp pain at the back of the head for several months. Nausea. Concern for cervical spine injury. Initial encounter. EXAM: CT HEAD WITHOUT CONTRAST CT CERVICAL SPINE WITHOUT CONTRAST TECHNIQUE: Multidetector CT imaging of the head and cervical spine was performed following the standard protocol without intravenous contrast. Multiplanar CT image reconstructions of the cervical spine were also generated. COMPARISON:  None. FINDINGS: CT HEAD FINDINGS There is no evidence of acute infarction, mass lesion, or intra- or extra-axial hemorrhage on CT. The posterior fossa, including the  cerebellum, brainstem and fourth ventricle, is within normal limits. The third and lateral ventricles, and basal ganglia are unremarkable in appearance. The cerebral hemispheres are symmetric in appearance, with normal gray-white differentiation. No mass effect or midline shift is seen. There is no evidence of fracture; visualized osseous structures are unremarkable in appearance. The visualized portions of the orbits are within normal limits. The paranasal sinuses and mastoid air cells are well-aerated. No significant soft tissue abnormalities are seen. CT CERVICAL SPINE FINDINGS There is no evidence of fracture or subluxation. Mild reversal of the normal lordotic curvature of the cervical spine is likely positional in nature. Vertebral bodies demonstrate normal height and alignment. Intervertebral disc spaces are preserved. Prevertebral soft tissues are within normal limits. The visualized neural foramina are grossly unremarkable. The thyroid gland is unremarkable in appearance. The visualized lung apices are clear. No significant soft tissue abnormalities are seen. IMPRESSION: 1. No evidence of traumatic intracranial injury or fracture. 2. No evidence of fracture or subluxation along the cervical spine. Electronically Signed   By: Roanna Raider M.D.   On: 01/13/2015 03:00   Ct Cervical Spine Wo Contrast 01/13/2015  CLINICAL DATA:  Lambert Mody  pain at the back of the head for several months. Nausea. Concern for cervical spine injury. Initial encounter. EXAM: CT HEAD WITHOUT CONTRAST CT CERVICAL SPINE WITHOUT CONTRAST TECHNIQUE: Multidetector CT imaging of the head and cervical spine was performed following the standard protocol without intravenous contrast. Multiplanar CT image reconstructions of the cervical spine were also generated. COMPARISON:  None. FINDINGS: CT HEAD FINDINGS There is no evidence of acute infarction, mass lesion, or intra- or extra-axial hemorrhage on CT. The posterior fossa, including the  cerebellum, brainstem and fourth ventricle, is within normal limits. The third and lateral ventricles, and basal ganglia are unremarkable in appearance. The cerebral hemispheres are symmetric in appearance, with normal gray-white differentiation. No mass effect or midline shift is seen. There is no evidence of fracture; visualized osseous structures are unremarkable in appearance. The visualized portions of the orbits are within normal limits. The paranasal sinuses and mastoid air cells are well-aerated. No significant soft tissue abnormalities are seen. CT CERVICAL SPINE FINDINGS There is no evidence of fracture or subluxation. Mild reversal of the normal lordotic curvature of the cervical spine is likely positional in nature. Vertebral bodies demonstrate normal height and alignment. Intervertebral disc spaces are preserved. Prevertebral soft tissues are within normal limits. The visualized neural foramina are grossly unremarkable. The thyroid gland is unremarkable in appearance. The visualized lung apices are clear. No significant soft tissue abnormalities are seen. IMPRESSION: 1. No evidence of traumatic intracranial injury or fracture. 2. No evidence of fracture or subluxation along the cervical spine. Electronically Signed   By: Roanna Raider M.D.   On: 01/13/2015 03:00    0345: Workup reassuring. Feels better after meds and wants to go home now. Tx symptomatically. Dx and testing d/w pt.  Questions answered.  Verb understanding, agreeable to d/c home with outpt f/u.     Samuel Jester, DO 01/15/15 1515

## 2015-01-13 NOTE — Discharge Instructions (Signed)
°Emergency Department Resource Guide °1) Find a Doctor and Pay Out of Pocket °Although you won't have to find out who is covered by your insurance plan, it is a good idea to ask around and get recommendations. You will then need to call the office and see if the doctor you have chosen will accept you as a new patient and what types of options they offer for patients who are self-pay. Some doctors offer discounts or will set up payment plans for their patients who do not have insurance, but you will need to ask so you aren't surprised when you get to your appointment. ° °2) Contact Your Local Health Department °Not all health departments have doctors that can see patients for sick visits, but many do, so it is worth a call to see if yours does. If you don't know where your local health department is, you can check in your phone book. The CDC also has a tool to help you locate your state's health department, and many state websites also have listings of all of their local health departments. ° °3) Find a Walk-in Clinic °If your illness is not likely to be very severe or complicated, you may want to try a walk in clinic. These are popping up all over the country in pharmacies, drugstores, and shopping centers. They're usually staffed by nurse practitioners or physician assistants that have been trained to treat common illnesses and complaints. They're usually fairly quick and inexpensive. However, if you have serious medical issues or chronic medical problems, these are probably not your best option. ° °No Primary Care Doctor: °- Call [facility] at  832-8000 - they can help you locate a primary care doctor that  accepts your insurance, provides certain services, etc. °- Physician Referral Service- 1-800-533-3463 ° °Chronic Pain Problems: °Organization         Address  Phone   Notes  °[facility]  (336) 297-2271 Patients need to be referred by their primary care doctor.  ° °Medication  Assistance: °Organization         Address  Phone   Notes  °[place] [facility] [address] °[place], [place] [address] (336) 641-8030 --Must be a resident of [place] °-- Must have NO insurance coverage whatsoever (no Medicaid/ Medicare, etc.) °-- The pt. MUST have a primary care doctor that directs their care regularly and follows them in the community °  °[facility]  (866) 331-1348   °[employer]  (888) 892-1162   ° °Agencies that provide inexpensive medical care: °Organization         Address  Phone   Notes  °[facility]  (336) 832-8035   °[facility]    (336) 832-7272   °[facility] [address] °[place], [place] [address] (336) 832-4777   °[facility] [address], °[place] (336) 271-4999   °[facility]    (336) 373-0678   °[facility]    (336) 272-1050   °[facility] ° [address], [place] Phone:  (336) 832-4444, Fax:  (336) 832-4440 Hours of Operation:  9 am - 6 pm, M-F.  Also accepts Medicaid/Medicare and self-pay.  °[facility] ° [address], [place] Phone: (336) 832-3150, Fax: (336) [phone]. Hours of Operation:  8:30 am - 5:30 pm, M-F.  Also accepts Medicaid and self-pay.  °[facility] [place] [address], [place] Phone: (336) 878-6027   °[facility] [address], [place], [place] (336)723-1848, Ext. 123 Mondays & Thursdays: 7-9 AM.  First 15 patients are seen on a first come, first serve basis. °  ° °Medicaid-accepting [place] Providers: ° °Organization         Address  Phone   Notes  °[facility] [address], [place] (336) 641-2100 Also accepts self-pay patients.  °[facility] [address], [place] ° (336) 856-9996   °[facility] [address], [place]  (336) 288-8857   °[facility] [address] [place] [address], [place] (336) 299-7000   °Veita Bland [address], [place]  ° (336) 373-1557 Only accepts [place] Access Medicaid patients after they have their name applied to their card.  ° °Self-Pay (no insurance) in [place]: ° °Organization         Address  Phone   Notes  °Sickle Cell Patients, [facility] [address], [place] (336) 832-1970   °[facility] [address], [place] (336) 832-4400   °[facility] ° [address], [place] (336) 992-4800   °[facility]/Dr. Osei-Bonsu ° [address] [place] [address], [place] or [address], [place] (336) 841-8500 Phone number for both [place] and [place] locations is the same.  °[facility] [address], [place] (336) 299-0000   °[facility] [address] [place] [address], [place] or [address] (336) 852-7530 °(336) 878-2260   °[facility] [address], [place] (336) 350-1642, phone; (336) 294-5005, fax Sees patients 1st and 3rd Saturday of every month.  Must not qualify for public or private insurance (i.e. Medicaid, Medicare, [place] Health Choice, Veterans' Benefits) • Household income should be no more than 200% of the poverty level •The clinic cannot treat you if you are pregnant or think you are pregnant • Sexually transmitted diseases are not treated at the clinic.  ° ° °Dental Care: °Organization         Address  Phone  Notes  °[place] [facility] [address], [place] (336) 641-6152 Accepts children up to age 21 who are enrolled in Medicaid or [place] Health Choice; pregnant women with a Medicaid card; and children who have applied for Medicaid or [place] Health Choice, but were declined, whose parents can pay a reduced fee at time of service.  °[place]  [facility] [place]  [address], [place] (336) 641-7733 Accepts children up to age 21 who are enrolled in Medicaid or [place] Health Choice; pregnant women with a Medicaid card; and children who have applied for Medicaid or [place] Health Choice, but were declined, whose parents can pay a reduced fee at time of service.  °[facility] ° [address], [place] (336) 641-4533 Patients are seen by appointment only. Walk-ins are not accepted. [facility] will see patients 18 years of age and older. °Monday - Tuesday (8am-5pm) °Most Wednesdays (8:30-5pm) °$30 per visit, cash only  °[facility] ° [address], [place] (336) 641-4533 Patients are seen by appointment only. Walk-ins are not accepted. [facility] will see patients 18 years of age and older. °One   Wednesday Evening (Monthly: Volunteer Based).  $30 per visit, cash only  °[facility]  (919) 537-3737 for adults; Children under age 4, call Graduate Pediatric Dentistry at (919) [phone]. Children aged 4-14, please call (919) [phone] to request a pediatric application. ° Dental services are provided in all areas of dental care including fillings, crowns and bridges, complete and partial dentures, implants, gum treatment, root canals, and extractions. Preventive care is also provided. Treatment is provided to both adults and children. °Patients are selected via a lottery and there is often a waiting list. °  °[facility] [address], °[place] ° (336) 763-8833 [email or web address] °  °[facility] [address], [place], [place] (336)723-1848, Ext. 123 Second and Fourth Thursday of each month, opens at 6:30 AM; Clinic ends at 9 AM.  Patients are seen on a first-come first-served basis, and a limited number are seen during each clinic.  ° °[facility] ° [address], [place], [place] (336) 723-7904    Eligibility Requirements °You must have lived in [place], [place], or [place] counties for at least the last three months. °  You cannot be eligible for state or federal sponsored healthcare insurance, including Veterans Administration, Medicaid, or Medicare. °  You generally cannot be eligible for healthcare insurance through your employer.  °  How to apply: °Eligibility screenings are held every Tuesday and Wednesday afternoon from 1:00 pm until 4:00 pm. You do not need an appointment for the interview!  °[facility] [address], [place], [place] 336-631-2330   °[facility]  336-342-8273   °[facility]  336-703-3100   °[facility]  336-570-6415   ° °Behavioral Health Resources in the Community: °Intensive Outpatient Programs °Organization         Address  Phone  Notes  °[facility] [address], [place], [place] 336-878-6098   °[facility] [address], [place], [place] 336-832-9800   °[facility] [address], [place], [place] ° 336-882-2125   °[facility] [address],  °[place], [place] 1-800-853-5163 or 336-641-4981   °Substance Abuse Resources °Organization         Address  Phone  Notes  °[facility]  336-882-2125   °[facility]  336-784-9470   °[facility]  336-285-9073   °[facility]  336-845-3988   °Residential & Outpatient Substance Abuse Program  1-800-659-3381   °Psychological Services °Organization         Address  Phone  Notes  °[facility]  336- 832-9600   °[facility]  336- 378-7881   °[facility] [address], [place] 1-800-853-5163 or 336-641-4981   ° °Mobile Crisis Teams °Organization         Address  Phone  Notes  °[facility], Mobile Crisis Care Unit  1-877-626-1772   °Assertive °[facility] ° [address]  [place], [place] 336-834-9664   °Sharon DeEsch [address] °[place] 336-554-5454   ° °Self-Help/Support Groups °Organization         Address  Phone             Notes  °[facility]  - variety of support groups  336- 373-1402 Call for more information  °Narcotics Anonymous (NA), [facility] [address], °[place]  2 meetings at this location  ° °  Residential Treatment Programs °Organization         Address  Phone  Notes  °[facility] [address],    °[place]  1-866-801-8205   °[facility] ° [address], [place], [place] 704-293-8524   °[facility] [address], [place] 336-845-3988 Admissions: 8am-3pm M-F  °[facility] [address]    °[place], [place] 336-841-1104   °[facility] [address], [place], [place] 336-379-7146   °[facility] [address]  °[place], [place] 336-285-9073   °[facility] - Intensive Outpatient [address], [place], [place] 336-852-3033   °[facility] [address]  °[place], [place] 1-877-615-2722 or 336-784-9470   °[facility] [address] [place], [place] 336-227-7417 Accepts Medicaid  °[facility] [address]  °[place] 1-800-659-3381 Substance Abuse/Addiction Treatment  ° °[place] Behavioral Health Resources °Organization         Address  Phone  Notes  °[facility]  (888) 581-9988   °Julie Brannon, PhD [address] [place], [place]   (336) 349-5553 or (336) 951-0000   °[facility]   [address] °[place], [place] (336) 349-4454   °[facility] [address], [place], [place] (336) 342-8316 Insurance/Medicaid/sponsorship through [facility]  °[facility] [address]                                    [place], [place] (336) 342-8316 Therapy/tele-psych/case    °[facility] [address]  ° [place],  (336) 349-2233    °Dr. Arfeen  (336) 349-4544   °[facility] [place]  [employer] [place] [facility] 1) [address], [place] °2) [address], [place] °3)  [address], [place] (336) 349-3220 °(336) 342-7768 ° °(336) 342-8140   °[place] [facility] (336) 342-1394 or (336) 342-3537 (After Hours)    ° ° °Take the prescriptions as directed. Also take over the counter tylenol, as directed on packaging, as needed for discomfort. Apply moist heat or ice to the area(s) of discomfort, for 15 minutes at a time, several times per day for the next few days.  Do not fall asleep on a heating or ice pack.  Call your regular medical doctor on Monday to schedule a follow up appointment this week.  Return to the Emergency Department immediately if worsening. ° °

## 2015-01-21 ENCOUNTER — Encounter (HOSPITAL_BASED_OUTPATIENT_CLINIC_OR_DEPARTMENT_OTHER): Payer: Self-pay | Admitting: Emergency Medicine

## 2015-01-21 ENCOUNTER — Emergency Department (HOSPITAL_BASED_OUTPATIENT_CLINIC_OR_DEPARTMENT_OTHER)
Admission: EM | Admit: 2015-01-21 | Discharge: 2015-01-22 | Disposition: A | Discharge status: Home / Self Care | Payer: 59 | Attending: Emergency Medicine | Admitting: Emergency Medicine

## 2015-01-21 DIAGNOSIS — Z792 Long term (current) use of antibiotics: Secondary | ICD-10-CM | POA: Diagnosis not present

## 2015-01-21 DIAGNOSIS — Z8742 Personal history of other diseases of the female genital tract: Secondary | ICD-10-CM | POA: Diagnosis not present

## 2015-01-21 DIAGNOSIS — Z8619 Personal history of other infectious and parasitic diseases: Secondary | ICD-10-CM | POA: Diagnosis not present

## 2015-01-21 DIAGNOSIS — L0231 Cutaneous abscess of buttock: Secondary | ICD-10-CM | POA: Diagnosis not present

## 2015-01-21 DIAGNOSIS — L0291 Cutaneous abscess, unspecified: Secondary | ICD-10-CM

## 2015-01-21 DIAGNOSIS — L089 Local infection of the skin and subcutaneous tissue, unspecified: Secondary | ICD-10-CM | POA: Diagnosis not present

## 2015-01-21 MED ORDER — LIDOCAINE-EPINEPHRINE (PF) 2 %-1:200000 IJ SOLN
10.0000 mL | Freq: Once | INTRAMUSCULAR | Status: AC
  Administered 2015-01-21: 10 mL via INTRADERMAL
  Filled 2015-01-21: qty 10

## 2015-01-21 NOTE — ED Notes (Signed)
Patient states that she has boil on her thigh.

## 2015-01-21 NOTE — ED Provider Notes (Signed)
CSN: [id number]     Arrival date & time 01/21/15  2041 History   First MD Initiated Contact with Patient 01/21/15 2236     Chief Complaint  Patient presents with  . Recurrent Skin Infections   HPI  [name] is a 29 year old female with history of abscesses presenting with an abscess. She states she noted the pain approximately 3 days ago. The abscess has grown in size and pain since then. The abscess is located on her right buttock where her gluteal fold meets the thigh. The pain is exacerbated by walking and any sort of pressure on the area. Denies fevers, chills, abdominal pain, nausea, vomiting, dizziness or myalgias. The abscess has not spontaneously drained.  Past Medical History  Diagnosis Date  . Bacterial vaginosis 12/05/2010  . H/O varicella   . H/O: varicose veins   . Yeast infection   . Abscess     surgery consult 2003  . Delayed menses 03/23/2013    6 week tab dec 5th .     Past Surgical History  Procedure Laterality Date  . Tonsillectomy  2010  . Hernia repair  age 89   Family History  Problem Relation Age of Onset  . Diabetes Maternal Grandmother   . Colon cancer Maternal Grandmother   . Hyperlipidemia Maternal Grandmother   . Heart disease Maternal Grandmother   . Cancer Maternal Grandmother   . Diabetes Maternal Grandfather   . Asthma Paternal Grandmother   . Heart disease Paternal Grandmother   . Hyperlipidemia Paternal Grandmother   . Stroke Paternal Grandmother    Social History  Substance Use Topics  . Smoking status: Never Smoker   . Smokeless tobacco: Never Used  . Alcohol Use: 0.0 oz/week    0 Standard drinks or equivalent per week   OB History    Gravida Para Term Preterm AB TAB SAB Ectopic Multiple Living   2    2 1 1    0      Obstetric Comments   Tab Feb 04 2013 failed condoms  About ? 6 weeks     Review of Systems  Skin: Positive for wound.  All other systems reviewed and are negative.     Allergies  Codeine  Home Medications    Prior to Admission medications   Medication Sig Start Date End Date Taking? Authorizing Provider  docusate sodium (COLACE) 100 MG capsule Take 1 capsule (100 mg total) by mouth 2 (two) times daily. Patient not taking: Reported on 01/12/2015 08/16/14   Elvina SidleKurt Lauenstein, MD  hydrocortisone (ANUSOL-HC) 2.5 % rectal cream Place 1 application rectally 2 (two) times daily. Patient not taking: Reported on 01/12/2015 08/16/14   Elvina SidleKurt Lauenstein, MD  methocarbamol (ROBAXIN) 500 MG tablet Take 2 tablets (1,000 mg total) by mouth 4 (four) times daily as needed for muscle spasms (muscle spasm/pain). 01/13/15   Samuel JesterKathleen McManus, DO  metroNIDAZOLE (FLAGYL) 500 MG tablet Take 500 mg by mouth 2 (two) times daily.    Historical Provider, MD  naproxen (NAPROSYN) 250 MG tablet Take 1 tablet (250 mg total) by mouth 2 (two) times daily as needed for mild pain or moderate pain (take with food). 01/13/15   Samuel JesterKathleen McManus, DO   BP 99/67 mmHg  Pulse 86  Temp(Src) 98.5 F (36.9 C) (Oral)  Resp 18  Ht 5\' 2"  (1.575 m)  Wt 175 lb (79.379 kg)  BMI 32.00 kg/m2  SpO2 100%  LMP 01/02/2015 Physical Exam  Constitutional: She appears well-developed and well-nourished. No distress.  HENT:  Head: Normocephalic and atraumatic.  Eyes: Conjunctivae are normal. Right eye exhibits no discharge. Left eye exhibits no discharge. No scleral icterus.  Neck: Normal range of motion.  Cardiovascular: Normal rate and regular rhythm.   Pulmonary/Chest: Effort normal. No respiratory distress.  Musculoskeletal: Normal range of motion.       Legs: Neurological: She is alert. Coordination normal.  Skin: Skin is warm and dry.  Extremely tender area of fluctuance at right gluteal fold. No surrounding induration. No overlying erythema. The area is not warm to touch. No spontaneous draining of the abscess currently.  Psychiatric: She has a normal mood and affect. Her behavior is normal.  Nursing note and vitals reviewed.   ED Course    Procedures (including critical care time)  INCISION AND DRAINAGE Performed by: Simeon Craft Consent: Verbal consent obtained. Risks and benefits: risks, benefits and alternatives were discussed Type: abscess  Body area: right gluteal fold  Anesthesia: local infiltration  Incision was made with a scalpel.  Local anesthetic: lidocaine 2% with epinephrine  Anesthetic total: 5 ml  Complexity: complex Blunt dissection to break up loculations  Drainage: purulent  Drainage amount: moderate  Patient tolerance: Patient tolerated the procedure well with no immediate complications.     Labs Review Labs Reviewed - No data to display  Imaging Review No results found. I have personally reviewed and evaluated these images and lab results as part of my medical decision-making.   EKG Interpretation None      MDM   Final diagnoses:  Abscess   Patient presenting with skin abscess of the right gluteal fold amenable to incision and drainage.  Patient tolerated the procedure well. No packing or drain inserted. Antibiotic therapy is not indicated. Encouraged warm soaks at home and keeping the wound clean and dry. Instructed to go to PCP or urgent care in 2 days for wound recheck. Patient expresses understanding and is stable for discharge.     Alveta Heimlich, PA-C 01/22/15 0044  Jerelyn Scott, MD 01/26/15 [id number]

## 2015-01-21 NOTE — Discharge Instructions (Signed)
Go to your primary care, urgent care or emergency Department in 2 days for a wound check. Return to the emergency department sooner if there are signs of infection.   Abscess An abscess is an infected area that contains a collection of pus and debris.It can occur in almost any part of the body. An abscess is also known as a furuncle or boil. CAUSES  An abscess occurs when tissue gets infected. This can occur from blockage of oil or sweat glands, infection of hair follicles, or a minor injury to the skin. As the body tries to fight the infection, pus collects in the area and creates pressure under the skin. This pressure causes pain. People with weakened immune systems have difficulty fighting infections and get certain abscesses more often.  SYMPTOMS Usually an abscess develops on the skin and becomes a painful mass that is red, warm, and tender. If the abscess forms under the skin, you may feel a moveable soft area under the skin. Some abscesses break open (rupture) on their own, but most will continue to get worse without care. The infection can spread deeper into the body and eventually into the bloodstream, causing you to feel ill.  DIAGNOSIS  Your caregiver will take your medical history and perform a physical exam. A sample of fluid may also be taken from the abscess to determine what is causing your infection. TREATMENT  Your caregiver may prescribe antibiotic medicines to fight the infection. However, taking antibiotics alone usually does not cure an abscess. Your caregiver may need to make a small cut (incision) in the abscess to drain the pus. In some cases, gauze is packed into the abscess to reduce pain and to continue draining the area. HOME CARE INSTRUCTIONS   Only take over-the-counter or prescription medicines for pain, discomfort, or fever as directed by your caregiver.  If you were prescribed antibiotics, take them as directed. Finish them even if you start to feel better.  If  gauze is used, follow your caregiver's directions for changing the gauze.  To avoid spreading the infection:  Keep your draining abscess covered with a bandage.  Wash your hands well.  Do not share personal care items, towels, or whirlpools with others.  Avoid skin contact with others.  Keep your skin and clothes clean around the abscess.  Keep all follow-up appointments as directed by your caregiver. SEEK MEDICAL CARE IF:   You have increased pain, swelling, redness, fluid drainage, or bleeding.  You have muscle aches, chills, or a general ill feeling.  You have a fever. MAKE SURE YOU:   Understand these instructions.  Will watch your condition.  Will get help right away if you are not doing well or get worse.   This information is not intended to replace advice given to you by your health care provider. Make sure you discuss any questions you have with your health care provider.   Document Released: 11/27/2004 Document Revised: 08/19/2011 Document Reviewed: 05/02/2011 Elsevier Interactive Patient Education 2016 Else[employer]  Incision and Drainage Incision and drainage is a procedure in which a sac-like structure (cystic structure) is opened and drained. The area to be drained usually contains material such as pus, fluid, or blood.  LET YOUR CAREGIVER KNOW ABOUT:   Allergies to medicine.  Medicines taken, including vitamins, herbs, eyedrops, over-the-counter medicines, and creams.  Use of steroids (by mouth or creams).  Previous problems with anesthetics or numbing medicines.  History of bleeding problems or blood clots.  Previous surgery.  Other health problems, including diabetes and kidney problems.  Possibility of pregnancy, if this applies. RISKS AND COMPLICATIONS  Pain.  Bleeding.  Scarring.  Infection. BEFORE THE PROCEDURE  You may need to have an ultrasound or other imaging tests to see how large or deep your cystic structure is. Blood tests  may also be used to determine if you have an infection or how severe the infection is. You may need to have a tetanus shot. PROCEDURE  The affected area is cleaned with a cleaning fluid. The cyst area will then be numbed with a medicine (local anesthetic). A small incision will be made in the cystic structure. A syringe or catheter may be used to drain the contents of the cystic structure, or the contents may be squeezed out. The area will then be flushed with a cleansing solution. After cleansing the area, it is often gently packed with a gauze or another wound dressing. Once it is packed, it will be covered with gauze and tape or some other type of wound dressing. AFTER THE PROCEDURE   Often, you will be allowed to go home right after the procedure.  You may be given antibiotic medicine to prevent or heal an infection.  If the area was packed with gauze or some other wound dressing, you will likely need to come back in 1 to 2 days to get it removed.  The area should heal in about 14 days.   This information is not intended to replace advice given to you by your health care provider. Make sure you discuss any questions you have with your health care provider.   Document Released: 08/13/2000 Document Revised: 08/19/2011 Document Reviewed: 04/14/2011 Elsevier Interactive Patient Education [employer] Else[employer] Inc[employer]

## 2016-03-03 NOTE — L&D Delivery Note (Addendum)
Patient is a 31 y.o. now [id number] s/p NSVD at 4637w5d, who was admitted for PROM. Developed Tripe I and received IV Amp/Gent.  Delivery Note At 2:52 AM a viable female was delivered via Vaginal, Spontaneous (Presentation: LOA).  APGAR: 8, 9; weight 6 lb 3.5 oz (2821 g).   Placenta status: intact, 3VC, mildly malodorous.   Anesthesia:  Epidural Episiotomy: None Lacerations: Right Labial, hemostatic. No repair. Suture Repair: none Est. Blood Loss (mL): 225  Mom to postpartum.  Baby to Couplet care / Skin to Skin.  Delivering provider: Dorathy KinsmanVirginia Sueann Brownley, CNM  Placenta to: BS Feeding: Breast Circ: NA Contraception: Undecided  Please schedule this patient for Postpartum visit in: 4 weeks with the following provider: Any provider  For C/S patients schedule nurse incision check in weeks 2 weeks: no  Low risk pregnancy complicated by: Nothing  Delivery mode: SVD  Anticipated Birth Control: undecided  PP Procedures needed: none  Schedule Integrated BH visit: no  Kandra NicolasJulie P Degele 03/01/2017

## 2016-07-21 ENCOUNTER — Ambulatory Visit (INDEPENDENT_AMBULATORY_CARE_PROVIDER_SITE_OTHER): Payer: Self-pay | Admitting: Family

## 2016-07-21 DIAGNOSIS — Z111 Encounter for screening for respiratory tuberculosis: Secondary | ICD-10-CM

## 2016-07-23 LAB — TB SKIN TEST
Induration: NEGATIVE mm
TB SKIN TEST: NEGATIVE

## 2016-09-01 ENCOUNTER — Encounter: Payer: Self-pay | Admitting: Obstetrics and Gynecology

## 2016-09-01 ENCOUNTER — Other Ambulatory Visit (HOSPITAL_COMMUNITY)
Admission: RE | Admit: 2016-09-01 | Discharge: 2016-09-01 | Disposition: A | Discharge status: Home / Self Care | Payer: Medicaid Other | Source: Ambulatory Visit | Attending: Obstetrics and Gynecology | Admitting: Obstetrics and Gynecology

## 2016-09-01 ENCOUNTER — Ambulatory Visit (INDEPENDENT_AMBULATORY_CARE_PROVIDER_SITE_OTHER): Payer: Medicaid Other | Admitting: Obstetrics and Gynecology

## 2016-09-01 DIAGNOSIS — Z3689 Encounter for other specified antenatal screening: Secondary | ICD-10-CM

## 2016-09-01 DIAGNOSIS — Z3481 Encounter for supervision of other normal pregnancy, first trimester: Secondary | ICD-10-CM

## 2016-09-01 DIAGNOSIS — Z3400 Encounter for supervision of normal first pregnancy, unspecified trimester: Secondary | ICD-10-CM | POA: Insufficient documentation

## 2016-09-01 DIAGNOSIS — Z3490 Encounter for supervision of normal pregnancy, unspecified, unspecified trimester: Secondary | ICD-10-CM

## 2016-09-01 NOTE — Progress Notes (Signed)
Subjective:    Christine Harris is a [id number] 2915w6d being seen today for her first obstetrical visit.  Her obstetrical history is significant for obesity. Patient does intend to breast feed. Pregnancy history fully reviewed.  Patient reports some nausea which is improving.  Vitals:   09/01/16 1405  BP: 107/69  Pulse: 85  Weight: 179 lb (81.2 kg)    HISTORY: OB History  Gravida Para Term Preterm AB Living  3 0 0 0 2 0  SAB TAB Ectopic Multiple Live Births  0 2 0 0 0    # Outcome Date GA Lbr Len/2nd Weight Sex Delivery Anes PTL Lv  3 Current           2 TAB 2014          1 TAB 06/11/05            Obstetric Comments  Tab Feb 04 2013 failed condoms  About ? 6 weeks   Past Medical History:  Diagnosis Date  . Abscess    surgery consult 2003  . Bacterial vaginosis 12/05/2010  . Delayed menses 03/23/2013   6 week tab dec 5th .    [name]. H/O varicella   . H/O: varicose veins   . Yeast infection    Past Surgical History:  Procedure Laterality Date  . HERNIA REPAIR  age 89  . TONSILLECTOMY  2010   Family History  Problem Relation Age of Onset  . Diabetes Maternal Grandmother   . Colon cancer Maternal Grandmother   . Hyperlipidemia Maternal Grandmother   . Heart disease Maternal Grandmother   . Cancer Maternal Grandmother   . Diabetes Maternal Grandfather   . Asthma Paternal Grandmother   . Heart disease Paternal Grandmother   . Hyperlipidemia Paternal Grandmother   . Stroke Paternal Grandmother   . Hypertension Mother   . Hypertension Father      Exam    Uterus:     Pelvic Exam:    Perineum: Normal Perineum   Vulva: normal   Vagina:  normal mucosa, normal discharge   pH:    Cervix: multiparous appearance   Adnexa: no mass, fullness, tenderness   Bony Pelvis: gynecoid  System: Breast:  normal appearance, no masses or tenderness   Skin: normal coloration and turgor, no rashes    Neurologic: oriented, no focal deficits   Extremities: normal strength, tone, and  muscle mass   HEENT extra ocular movement intact   Mouth/Teeth mucous membranes moist, pharynx normal without lesions and dental hygiene good   Neck supple and no masses   Cardiovascular: regular rate and rhythm   Respiratory:  chest clear, no wheezing, crepitations, rhonchi, normal symmetric air entry   Abdomen: soft, non-tender; bowel sounds normal; no masses,  no organomegaly   Urinary:       Assessment:    Pregnancy: [id number] Patient Active Problem List   Diagnosis Date Noted  . Encounter for supervision of normal pregnancy, unspecified, unspecified trimester 09/01/2016  . Visit for preventive health examination 06/07/2013  . Initiation of Depo Provera 06/07/2013  . Other general counseling and advice for contraceptive management 03/23/2013  . Breast swelling 03/23/2013  . Recurrent boils 10/29/2011  . Body piercing 10/29/2011  . Contraception management 10/29/2011        Plan:     Initial labs drawn. Prenatal vitamins. Problem list reviewed and updated. Genetic Screening discussed Quad Screen: requested Will be done at her next visit  Ultrasound discussed; fetal survey: requested.  Follow up  in 4 weeks. 50% of 30 min visit spent on counseling and coordination of care.     Christine Harris 09/01/2016

## 2016-09-01 NOTE — Addendum Note (Signed)
Addended by: Dalphine HandingGARDNER, Cherika Jessie L on: 09/01/2016 03:54 PM   Modules accepted: Orders

## 2016-09-01 NOTE — Patient Instructions (Signed)
 First Trimester of Pregnancy The first trimester of pregnancy is from week 1 until the end of week 13 (months 1 through 3). A week after a sperm fertilizes an egg, the egg will implant on the wall of the uterus. This embryo will begin to develop into a baby. Genes from you and your partner will form the baby. The female genes will determine whether the baby will be a boy or a girl. At 6-8 weeks, the eyes and face will be formed, and the heartbeat can be seen on ultrasound. At the end of 12 weeks, all the baby's organs will be formed. Now that you are pregnant, you will want to do everything you can to have a healthy baby. Two of the most important things are to get good prenatal care and to follow your health care provider's instructions. Prenatal care is all the medical care you receive before the baby's birth. This care will help prevent, find, and treat any problems during the pregnancy and childbirth. Body changes during your first trimester Your body goes through many changes during pregnancy. The changes vary from woman to woman.  You may gain or lose a couple of pounds at first.  You may feel sick to your stomach (nauseous) and you may throw up (vomit). If the vomiting is uncontrollable, call your health care provider.  You may tire easily.  You may develop headaches that can be relieved by medicines. All medicines should be approved by your health care provider.  You may urinate more often. Painful urination may mean you have a bladder infection.  You may develop heartburn as a result of your pregnancy.  You may develop constipation because certain hormones are causing the muscles that push stool through your intestines to slow down.  You may develop hemorrhoids or swollen veins (varicose veins).  Your breasts may begin to grow larger and become tender. Your nipples may stick out more, and the tissue that surrounds them (areola) may become darker.  Your gums may bleed and may be  sensitive to brushing and flossing.  Dark spots or blotches (chloasma, mask of pregnancy) may develop on your face. This will likely fade after the baby is born.  Your menstrual periods will stop.  You may have a loss of appetite.  You may develop cravings for certain kinds of food.  You may have changes in your emotions from day to day, such as being excited to be pregnant or being concerned that something may go wrong with the pregnancy and baby.  You may have more vivid and strange dreams.  You may have changes in your hair. These can include thickening of your hair, rapid growth, and changes in texture. Some women also have hair loss during or after pregnancy, or hair that feels dry or thin. Your hair will most likely return to normal after your baby is born.  What to expect at prenatal visits During a routine prenatal visit:  You will be weighed to make sure you and the baby are growing normally.  Your blood pressure will be taken.  Your abdomen will be measured to track your baby's growth.  The fetal heartbeat will be listened to between weeks 10 and 14 of your pregnancy.  Test results from any previous visits will be discussed.  Your health care provider may ask you:  How you are feeling.  If you are feeling the baby move.  If you have had any abnormal symptoms, such as leaking fluid, bleeding, severe   headaches, or abdominal cramping.  If you are using any tobacco products, including cigarettes, chewing tobacco, and electronic cigarettes.  If you have any questions.  Other tests that may be performed during your first trimester include:  Blood tests to find your blood type and to check for the presence of any previous infections. The tests will also be used to check for low iron levels (anemia) and protein on red blood cells (Rh antibodies). Depending on your risk factors, or if you previously had diabetes during pregnancy, you may have tests to check for high blood  sugar that affects pregnant women (gestational diabetes).  Urine tests to check for infections, diabetes, or protein in the urine.  An ultrasound to confirm the proper growth and development of the baby.  Fetal screens for spinal cord problems (spina bifida) and Down syndrome.  HIV (human immunodeficiency virus) testing. Routine prenatal testing includes screening for HIV, unless you choose not to have this test.  You may need other tests to make sure you and the baby are doing well.  Follow these instructions at home: Medicines  Follow your health care provider's instructions regarding medicine use. Specific medicines may be either safe or unsafe to take during pregnancy.  Take a prenatal vitamin that contains at least 600 micrograms (mcg) of folic acid.  If you develop constipation, try taking a stool softener if your health care provider approves. Eating and drinking  Eat a balanced diet that includes fresh fruits and vegetables, whole grains, good sources of protein such as meat, eggs, or tofu, and low-fat dairy. Your health care provider will help you determine the amount of weight gain that is right for you.  Avoid raw meat and uncooked cheese. These carry germs that can cause birth defects in the baby.  Eating four or five small meals rather than three large meals a day may help relieve nausea and vomiting. If you start to feel nauseous, eating a few soda crackers can be helpful. Drinking liquids between meals, instead of during meals, also seems to help ease nausea and vomiting.  Limit foods that are high in fat and processed sugars, such as fried and sweet foods.  To prevent constipation: ? Eat foods that are high in fiber, such as fresh fruits and vegetables, whole grains, and beans. ? Drink enough fluid to keep your urine clear or pale yellow. Activity  Exercise only as directed by your health care provider. Most women can continue their usual exercise routine during  pregnancy. Try to exercise for 30 minutes at least 5 days a week. Exercising will help you: ? Control your weight. ? Stay in shape. ? Be prepared for labor and delivery.  Experiencing pain or cramping in the lower abdomen or lower back is a good sign that you should stop exercising. Check with your health care provider before continuing with normal exercises.  Try to avoid standing for long periods of time. Move your legs often if you must stand in one place for a long time.  Avoid heavy lifting.  Wear low-heeled shoes and practice good posture.  You may continue to have sex unless your health care provider tells you not to. Relieving pain and discomfort  Wear a good support bra to relieve breast tenderness.  Take warm sitz baths to soothe any pain or discomfort caused by hemorrhoids. Use hemorrhoid cream if your health care provider approves.  Rest with your legs elevated if you have leg cramps or low back pain.  If you   develop varicose veins in your legs, wear support hose. Elevate your feet for 15 minutes, 3-4 times a day. Limit salt in your diet. Prenatal care  Schedule your prenatal visits by the twelfth week of pregnancy. They are usually scheduled monthly at first, then more often in the last 2 months before delivery.  Write down your questions. Take them to your prenatal visits.  Keep all your prenatal visits as told by your health care provider. This is important. Safety  Wear your seat belt at all times when driving.  Make a list of emergency phone numbers, including numbers for family, friends, the hospital, and police and fire departments. General instructions  Ask your health care provider for a referral to a local prenatal education class. Begin classes no later than the beginning of month 6 of your pregnancy.  Ask for help if you have counseling or nutritional needs during pregnancy. Your health care provider can offer advice or refer you to specialists for help  with various needs.  Do not use hot tubs, steam rooms, or saunas.  Do not douche or use tampons or scented sanitary pads.  Do not cross your legs for long periods of time.  Avoid cat litter boxes and soil used by cats. These carry germs that can cause birth defects in the baby and possibly loss of the fetus by miscarriage or stillbirth.  Avoid all smoking, herbs, alcohol, and medicines not prescribed by your health care provider. Chemicals in these products affect the formation and growth of the baby.  Do not use any products that contain nicotine or tobacco, such as cigarettes and e-cigarettes. If you need help quitting, ask your health care provider. You may receive counseling support and other resources to help you quit.  Schedule a dentist appointment. At home, brush your teeth with a soft toothbrush and be gentle when you floss. Contact a health care provider if:  You have dizziness.  You have mild pelvic cramps, pelvic pressure, or nagging pain in the abdominal area.  You have persistent nausea, vomiting, or diarrhea.  You have a bad smelling vaginal discharge.  You have pain when you urinate.  You notice increased swelling in your face, hands, legs, or ankles.  You are exposed to fifth disease or chickenpox.  You are exposed to German measles (rubella) and have never had it. Get help right away if:  You have a fever.  You are leaking fluid from your vagina.  You have spotting or bleeding from your vagina.  You have severe abdominal cramping or pain.  You have rapid weight gain or loss.  You vomit blood or material that looks like coffee grounds.  You develop a severe headache.  You have shortness of breath.  You have any kind of trauma, such as from a fall or a car accident. Summary  The first trimester of pregnancy is from week 1 until the end of week 13 (months 1 through 3).  Your body goes through many changes during pregnancy. The changes vary from  woman to woman.  You will have routine prenatal visits. During those visits, your health care provider will examine you, discuss any test results you may have, and talk with you about how you are feeling. This information is not intended to replace advice given to you by your health care provider. Make sure you discuss any questions you have with your health care provider. Document Released: 02/11/2001 Document Revised: 01/30/2016 Document Reviewed: 01/30/2016 [employer] Interactive Patient Education  2017   [employer]   Second Trimester of Pregnancy The second trimester is from week 14 through week 27 (months 4 through 6). The second trimester is often a time when you feel your best. Your body has adjusted to being pregnant, and you begin to feel better physically. Usually, morning sickness has lessened or quit completely, you may have more energy, and you may have an increase in appetite. The second trimester is also a time when the fetus is growing rapidly. At the end of the sixth month, the fetus is about 9 inches long and weighs about 1 pounds. You will likely begin to feel the baby move (quickening) between 16 and 20 weeks of pregnancy. Body changes during your second trimester Your body continues to go through many changes during your second trimester. The changes vary from woman to woman.  Your weight will continue to increase. You will notice your lower abdomen bulging out.  You may begin to get stretch marks on your hips, abdomen, and breasts.  You may develop headaches that can be relieved by medicines. The medicines should be approved by your health care provider.  You may urinate more often because the fetus is pressing on your bladder.  You may develop or continue to have heartburn as a result of your pregnancy.  You may develop constipation because certain hormones are causing the muscles that push waste through your intestines to slow down.  You may develop hemorrhoids or  swollen, bulging veins (varicose veins).  You may have back pain. This is caused by: ? Weight gain. ? Pregnancy hormones that are relaxing the joints in your pelvis. ? A shift in weight and the muscles that support your balance.  Your breasts will continue to grow and they will continue to become tender.  Your gums may bleed and may be sensitive to brushing and flossing.  Dark spots or blotches (chloasma, mask of pregnancy) may develop on your face. This will likely fade after the baby is born.  A dark line from your belly button to the pubic area (linea nigra) may appear. This will likely fade after the baby is born.  You may have changes in your hair. These can include thickening of your hair, rapid growth, and changes in texture. Some women also have hair loss during or after pregnancy, or hair that feels dry or thin. Your hair will most likely return to normal after your baby is born.  What to expect at prenatal visits During a routine prenatal visit:  You will be weighed to make sure you and the fetus are growing normally.  Your blood pressure will be taken.  Your abdomen will be measured to track your baby's growth.  The fetal heartbeat will be listened to.  Any test results from the previous visit will be discussed.  Your health care provider may ask you:  How you are feeling.  If you are feeling the baby move.  If you have had any abnormal symptoms, such as leaking fluid, bleeding, severe headaches, or abdominal cramping.  If you are using any tobacco products, including cigarettes, chewing tobacco, and electronic cigarettes.  If you have any questions.  Other tests that may be performed during your second trimester include:  Blood tests that check for: ? Low iron levels (anemia). ? High blood sugar that affects pregnant women (gestational diabetes) between 24 and 28 weeks. ? Rh antibodies. This is to check for a protein on red blood cells (Rh factor).  Urine  tests to   check for infections, diabetes, or protein in the urine.  An ultrasound to confirm the proper growth and development of the baby.  An amniocentesis to check for possible genetic problems.  Fetal screens for spina bifida and Down syndrome.  HIV (human immunodeficiency virus) testing. Routine prenatal testing includes screening for HIV, unless you choose not to have this test.  Follow these instructions at home: Medicines  Follow your health care provider's instructions regarding medicine use. Specific medicines may be either safe or unsafe to take during pregnancy.  Take a prenatal vitamin that contains at least 600 micrograms (mcg) of folic acid.  If you develop constipation, try taking a stool softener if your health care provider approves. Eating and drinking  Eat a balanced diet that includes fresh fruits and vegetables, whole grains, good sources of protein such as meat, eggs, or tofu, and low-fat dairy. Your health care provider will help you determine the amount of weight gain that is right for you.  Avoid raw meat and uncooked cheese. These carry germs that can cause birth defects in the baby.  If you have low calcium intake from food, talk to your health care provider about whether you should take a daily calcium supplement.  Limit foods that are high in fat and processed sugars, such as fried and sweet foods.  To prevent constipation: ? Drink enough fluid to keep your urine clear or pale yellow. ? Eat foods that are high in fiber, such as fresh fruits and vegetables, whole grains, and beans. Activity  Exercise only as directed by your health care provider. Most women can continue their usual exercise routine during pregnancy. Try to exercise for 30 minutes at least 5 days a week. Stop exercising if you experience uterine contractions.  Avoid heavy lifting, wear low heel shoes, and practice good posture.  A sexual relationship may be continued unless your health  care provider directs you otherwise. Relieving pain and discomfort  Wear a good support bra to prevent discomfort from breast tenderness.  Take warm sitz baths to soothe any pain or discomfort caused by hemorrhoids. Use hemorrhoid cream if your health care provider approves.  Rest with your legs elevated if you have leg cramps or low back pain.  If you develop varicose veins, wear support hose. Elevate your feet for 15 minutes, 3-4 times a day. Limit salt in your diet. Prenatal Care  Write down your questions. Take them to your prenatal visits.  Keep all your prenatal visits as told by your health care provider. This is important. Safety  Wear your seat belt at all times when driving.  Make a list of emergency phone numbers, including numbers for family, friends, the hospital, and police and fire departments. General instructions  Ask your health care provider for a referral to a local prenatal education class. Begin classes no later than the beginning of month 6 of your pregnancy.  Ask for help if you have counseling or nutritional needs during pregnancy. Your health care provider can offer advice or refer you to specialists for help with various needs.  Do not use hot tubs, steam rooms, or saunas.  Do not douche or use tampons or scented sanitary pads.  Do not cross your legs for long periods of time.  Avoid cat litter boxes and soil used by cats. These carry germs that can cause birth defects in the baby and possibly loss of the fetus by miscarriage or stillbirth.  Avoid all smoking, herbs, alcohol, and unprescribed drugs. Chemicals   in these products can affect the formation and growth of the baby.  Do not use any products that contain nicotine or tobacco, such as cigarettes and e-cigarettes. If you need help quitting, ask your health care provider.  Visit your dentist if you have not gone yet during your pregnancy. Use a soft toothbrush to brush your teeth and be gentle when  you floss. Contact a health care provider if:  You have dizziness.  You have mild pelvic cramps, pelvic pressure, or nagging pain in the abdominal area.  You have persistent nausea, vomiting, or diarrhea.  You have a bad smelling vaginal discharge.  You have pain when you urinate. Get help right away if:  You have a fever.  You are leaking fluid from your vagina.  You have spotting or bleeding from your vagina.  You have severe abdominal cramping or pain.  You have rapid weight gain or weight loss.  You have shortness of breath with chest pain.  You notice sudden or extreme swelling of your face, hands, ankles, feet, or legs.  You have not felt your baby move in over an hour.  You have severe headaches that do not go away when you take medicine.  You have vision changes. Summary  The second trimester is from week 14 through week 27 (months 4 through 6). It is also a time when the fetus is growing rapidly.  Your body goes through many changes during pregnancy. The changes vary from woman to woman.  Avoid all smoking, herbs, alcohol, and unprescribed drugs. These chemicals affect the formation and growth your baby.  Do not use any tobacco products, such as cigarettes, chewing tobacco, and e-cigarettes. If you need help quitting, ask your health care provider.  Contact your health care provider if you have any questions. Keep all prenatal visits as told by your health care provider. This is important. This information is not intended to replace advice given to you by your health care provider. Make sure you discuss any questions you have with your health care provider. Document Released: 02/11/2001 Document Revised: 07/26/2015 Document Reviewed: 04/20/2012 [employer] Interactive Patient Education  2017 [employer]  Contraception Choices Contraception (birth control) is the use of any methods or devices to prevent pregnancy. Below are some methods to help avoid  pregnancy. Hormonal methods  Contraceptive implant. This is a thin, plastic tube containing progesterone hormone. It does not contain estrogen hormone. Your health care provider inserts the tube in the inner part of the upper arm. The tube can remain in place for up to 3 years. After 3 years, the implant must be removed. The implant prevents the ovaries from releasing an egg (ovulation), thickens the cervical mucus to prevent sperm from entering the uterus, and thins the lining of the inside of the uterus.  Progesterone-only injections. These injections are given every 3 months by your health care provider to prevent pregnancy. This synthetic progesterone hormone stops the ovaries from releasing eggs. It also thickens cervical mucus and changes the uterine lining. This makes it harder for sperm to survive in the uterus.  Birth control pills. These pills contain estrogen and progesterone hormone. They work by preventing the ovaries from releasing eggs (ovulation). They also cause the cervical mucus to thicken, preventing the sperm from entering the uterus. Birth control pills are prescribed by a health care provider.Birth control pills can also be used to treat heavy periods.  Minipill. This type of birth control pill contains only the progesterone hormone. They are   taken every day of each month and must be prescribed by your health care provider.  Birth control patch. The patch contains hormones similar to those in birth control pills. It must be changed once a week and is prescribed by a health care provider.  Vaginal ring. The ring contains hormones similar to those in birth control pills. It is left in the vagina for 3 weeks, removed for 1 week, and then a new one is put back in place. The patient must be comfortable inserting and removing the ring from the vagina.A health care provider's prescription is necessary.  Emergency contraception. Emergency contraceptives prevent pregnancy after  unprotected sexual intercourse. This pill can be taken right after sex or up to 5 days after unprotected sex. It is most effective the sooner you take the pills after having sexual intercourse. Most emergency contraceptive pills are available without a prescription. Check with your pharmacist. Do not use emergency contraception as your only form of birth control. Barrier methods  Female condom. This is a thin sheath (latex or rubber) that is worn over the penis during sexual intercourse. It can be used with spermicide to increase effectiveness.  Female condom. This is a soft, loose-fitting sheath that is put into the vagina before sexual intercourse.  Diaphragm. This is a soft, latex, dome-shaped barrier that must be fitted by a health care provider. It is inserted into the vagina, along with a spermicidal jelly. It is inserted before intercourse. The diaphragm should be left in the vagina for 6 to 8 hours after intercourse.  Cervical cap. This is a round, soft, latex or plastic cup that fits over the cervix and must be fitted by a health care provider. The cap can be left in place for up to 48 hours after intercourse.  Sponge. This is a soft, circular piece of polyurethane foam. The sponge has spermicide in it. It is inserted into the vagina after wetting it and before sexual intercourse.  Spermicides. These are chemicals that kill or block sperm from entering the cervix and uterus. They come in the form of creams, jellies, suppositories, foam, or tablets. They do not require a prescription. They are inserted into the vagina with an applicator before having sexual intercourse. The process must be repeated every time you have sexual intercourse. Intrauterine contraception  Intrauterine device (IUD). This is a T-shaped device that is put in a woman's uterus during a menstrual period to prevent pregnancy. There are 2 types: ? Copper IUD. This type of IUD is wrapped in copper wire and is placed inside  the uterus. Copper makes the uterus and fallopian tubes produce a fluid that kills sperm. It can stay in place for 10 years. ? Hormone IUD. This type of IUD contains the hormone progestin (synthetic progesterone). The hormone thickens the cervical mucus and prevents sperm from entering the uterus, and it also thins the uterine lining to prevent implantation of a fertilized egg. The hormone can weaken or kill the sperm that get into the uterus. It can stay in place for 3-5 years, depending on which type of IUD is used. Permanent methods of contraception  Female tubal ligation. This is when the woman's fallopian tubes are surgically sealed, tied, or blocked to prevent the egg from traveling to the uterus.  Hysteroscopic sterilization. This involves placing a small coil or insert into each fallopian tube. Your doctor uses a technique called hysteroscopy to do the procedure. The device causes scar tissue to form. This results in permanent blockage   of the fallopian tubes, so the sperm cannot fertilize the egg. It takes about 3 months after the procedure for the tubes to become blocked. You must use another form of birth control for these 3 months.  Female sterilization. This is when the female has the tubes that carry sperm tied off (vasectomy).This blocks sperm from entering the vagina during sexual intercourse. After the procedure, the man can still ejaculate fluid (semen). Natural planning methods  Natural family planning. This is not having sexual intercourse or using a barrier method (condom, diaphragm, cervical cap) on days the woman could become pregnant.  Calendar method. This is keeping track of the length of each menstrual cycle and identifying when you are fertile.  Ovulation method. This is avoiding sexual intercourse during ovulation.  Symptothermal method. This is avoiding sexual intercourse during ovulation, using a thermometer and ovulation symptoms.  Post-ovulation method. This is timing  sexual intercourse after you have ovulated. Regardless of which type or method of contraception you choose, it is important that you use condoms to protect against the transmission of sexually transmitted infections (STIs). Talk with your health care provider about which form of contraception is most appropriate for you. This information is not intended to replace advice given to you by your health care provider. Make sure you discuss any questions you have with your health care provider. Document Released: 02/17/2005 Document Revised: 07/26/2015 Document Reviewed: 08/12/2012 Elsevier Interactive Patient Education  2017 [employer]   Breastfeeding Deciding to breastfeed is one of the best choices you can make for you and your baby. A change in hormones during pregnancy causes your breast tissue to grow and increases the number and size of your milk ducts. These hormones also allow proteins, sugars, and fats from your blood supply to make breast milk in your milk-producing glands. Hormones prevent breast milk from being released before your baby is born as well as prompt milk flow after birth. Once breastfeeding has begun, thoughts of your baby, as well as his or her sucking or crying, can stimulate the release of milk from your milk-producing glands. Benefits of breastfeeding For Your Baby  Your first milk (colostrum) helps your baby's digestive system function better.  There are antibodies in your milk that help your baby fight off infections.  Your baby has a lower incidence of asthma, allergies, and sudden infant death syndrome.  The nutrients in breast milk are better for your baby than infant formulas and are designed uniquely for your baby's needs.  Breast milk improves your baby's brain development.  Your baby is less likely to develop other conditions, such as childhood obesity, asthma, or type 2 diabetes mellitus.  For You  Breastfeeding helps to create a very special bond  between you and your baby.  Breastfeeding is convenient. Breast milk is always available at the correct temperature and costs nothing.  Breastfeeding helps to burn calories and helps you lose the weight gained during pregnancy.  Breastfeeding makes your uterus contract to its prepregnancy size faster and slows bleeding (lochia) after you give birth.  Breastfeeding helps to lower your risk of developing type 2 diabetes mellitus, osteoporosis, and breast or ovarian cancer later in life.  Signs that your baby is hungry Early Signs of Hunger  Increased alertness or activity.  Stretching.  Movement of the head from side to side.  Movement of the head and opening of the mouth when the corner of the mouth or cheek is stroked (rooting).  Increased sucking sounds, smacking lips,   cooing, sighing, or squeaking.  Hand-to-mouth movements.  Increased sucking of fingers or hands.  Late Signs of Hunger  Fussing.  Intermittent crying.  Extreme Signs of Hunger Signs of extreme hunger will require calming and consoling before your baby will be able to breastfeed successfully. Do not wait for the following signs of extreme hunger to occur before you initiate breastfeeding:  Restlessness.  A loud, strong cry.  Screaming.  Breastfeeding basics Breastfeeding Initiation  Find a comfortable place to sit or lie down, with your neck and back well supported.  Place a pillow or rolled up blanket under your baby to bring him or her to the level of your breast (if you are seated). Nursing pillows are specially designed to help support your arms and your baby while you breastfeed.  Make sure that your baby's abdomen is facing your abdomen.  Gently massage your breast. With your fingertips, massage from your chest wall toward your nipple in a circular motion. This encourages milk flow. You may need to continue this action during the feeding if your milk flows slowly.  Support your breast with 4  fingers underneath and your thumb above your nipple. Make sure your fingers are well away from your nipple and your baby's mouth.  Stroke your baby's lips gently with your finger or nipple.  When your baby's mouth is open wide enough, quickly bring your baby to your breast, placing your entire nipple and as much of the colored area around your nipple (areola) as possible into your baby's mouth. ? More areola should be visible above your baby's upper lip than below the lower lip. ? Your baby's tongue should be between his or her lower gum and your breast.  Ensure that your baby's mouth is correctly positioned around your nipple (latched). Your baby's lips should create a seal on your breast and be turned out (everted).  It is common for your baby to suck about 2-3 minutes in order to start the flow of breast milk.  Latching Teaching your baby how to latch on to your breast properly is very important. An improper latch can cause nipple pain and decreased milk supply for you and poor weight gain in your baby. Also, if your baby is not latched onto your nipple properly, he or she may swallow some air during feeding. This can make your baby fussy. Burping your baby when you switch breasts during the feeding can help to get rid of the air. However, teaching your baby to latch on properly is still the best way to prevent fussiness from swallowing air while breastfeeding. Signs that your baby has successfully latched on to your nipple:  Silent tugging or silent sucking, without causing you pain.  Swallowing heard between every 3-4 sucks.  Muscle movement above and in front of his or her ears while sucking.  Signs that your baby has not successfully latched on to nipple:  Sucking sounds or smacking sounds from your baby while breastfeeding.  Nipple pain.  If you think your baby has not latched on correctly, slip your finger into the corner of your baby's mouth to break the suction and place it  between your baby's gums. Attempt breastfeeding initiation again. Signs of Successful Breastfeeding Signs from your baby:  A gradual decrease in the number of sucks or complete cessation of sucking.  Falling asleep.  Relaxation of his or her body.  Retention of a small amount of milk in his or her mouth.  Letting go of your breast   by himself or herself.  Signs from you:  Breasts that have increased in firmness, weight, and size 1-3 hours after feeding.  Breasts that are softer immediately after breastfeeding.  Increased milk volume, as well as a change in milk consistency and color by the fifth day of breastfeeding.  Nipples that are not sore, cracked, or bleeding.  Signs That Your Baby is Getting Enough Milk  Wetting at least 1-2 diapers during the first 24 hours after birth.  Wetting at least 5-6 diapers every 24 hours for the first week after birth. The urine should be clear or pale yellow by 5 days after birth.  Wetting 6-8 diapers every 24 hours as your baby continues to grow and develop.  At least 3 stools in a 24-hour period by age 5 days. The stool should be soft and yellow.  At least 3 stools in a 24-hour period by age 7 days. The stool should be seedy and yellow.  No loss of weight greater than 10% of birth weight during the first 3 days of age.  Average weight gain of 4-7 ounces (113-198 g) per week after age 4 days.  Consistent daily weight gain by age 5 days, without weight loss after the age of 2 weeks.  After a feeding, your baby may spit up a small amount. This is common. Breastfeeding frequency and duration Frequent feeding will help you make more milk and can prevent sore nipples and breast engorgement. Breastfeed when you feel the need to reduce the fullness of your breasts or when your baby shows signs of hunger. This is called "breastfeeding on demand." Avoid introducing a pacifier to your baby while you are working to establish breastfeeding (the  first 4-6 weeks after your baby is born). After this time you may choose to use a pacifier. Research has shown that pacifier use during the first year of a baby's life decreases the risk of sudden infant death syndrome (SIDS). Allow your baby to feed on each breast as long as he or she wants. Breastfeed until your baby is finished feeding. When your baby unlatches or falls asleep while feeding from the first breast, offer the second breast. Because newborns are often sleepy in the first few weeks of life, you may need to awaken your baby to get him or her to feed. Breastfeeding times will vary from baby to baby. However, the following rules can serve as a guide to help you ensure that your baby is properly fed:  Newborns (babies 4 weeks of age or younger) may breastfeed every 1-3 hours.  Newborns should not go longer than 3 hours during the day or 5 hours during the night without breastfeeding.  You should breastfeed your baby a minimum of 8 times in a 24-hour period until you begin to introduce solid foods to your baby at around 6 months of age.  Breast milk pumping Pumping and storing breast milk allows you to ensure that your baby is exclusively fed your breast milk, even at times when you are unable to breastfeed. This is especially important if you are going back to work while you are still breastfeeding or when you are not able to be present during feedings. Your lactation consultant can give you guidelines on how long it is safe to store breast milk. A breast pump is a machine that allows you to pump milk from your breast into a sterile bottle. The pumped breast milk can then be stored in a refrigerator or freezer. Some breast   pumps are operated by hand, while others use electricity. Ask your lactation consultant which type will work best for you. Breast pumps can be purchased, but some hospitals and breastfeeding support groups lease breast pumps on a monthly basis. A lactation consultant can  teach you how to hand express breast milk, if you prefer not to use a pump. Caring for your breasts while you breastfeed Nipples can become dry, cracked, and sore while breastfeeding. The following recommendations can help keep your breasts moisturized and healthy:  Avoid using soap on your nipples.  Wear a supportive bra. Although not required, special nursing bras and tank tops are designed to allow access to your breasts for breastfeeding without taking off your entire bra or top. Avoid wearing underwire-style bras or extremely tight bras.  Air dry your nipples for 3-4minutes after each feeding.  Use only cotton bra pads to absorb leaked breast milk. Leaking of breast milk between feedings is normal.  Use lanolin on your nipples after breastfeeding. Lanolin helps to maintain your skin's normal moisture barrier. If you use pure lanolin, you do not need to wash it off before feeding your baby again. Pure lanolin is not toxic to your baby. You may also hand express a few drops of breast milk and gently massage that milk into your nipples and allow the milk to air dry.  In the first few weeks after giving birth, some women experience extremely full breasts (engorgement). Engorgement can make your breasts feel heavy, warm, and tender to the touch. Engorgement peaks within 3-5 days after you give birth. The following recommendations can help ease engorgement:  Completely empty your breasts while breastfeeding or pumping. You may want to start by applying warm, moist heat (in the shower or with warm water-soaked hand towels) just before feeding or pumping. This increases circulation and helps the milk flow. If your baby does not completely empty your breasts while breastfeeding, pump any extra milk after he or she is finished.  Wear a snug bra (nursing or regular) or tank top for 1-2 days to signal your body to slightly decrease milk production.  Apply ice packs to your breasts, unless this is too  uncomfortable for you.  Make sure that your baby is latched on and positioned properly while breastfeeding.  If engorgement persists after 48 hours of following these recommendations, contact your health care provider or a lactation consultant. Overall health care recommendations while breastfeeding  Eat healthy foods. Alternate between meals and snacks, eating 3 of each per day. Because what you eat affects your breast milk, some of the foods may make your baby more irritable than usual. Avoid eating these foods if you are sure that they are negatively affecting your baby.  Drink milk, fruit juice, and water to satisfy your thirst (about 10 glasses a day).  Rest often, relax, and continue to take your prenatal vitamins to prevent fatigue, stress, and anemia.  Continue breast self-awareness checks.  Avoid chewing and smoking tobacco. Chemicals from cigarettes that pass into breast milk and exposure to secondhand smoke may harm your baby.  Avoid alcohol and drug use, including marijuana. Some medicines that may be harmful to your baby can pass through breast milk. It is important to ask your health care provider before taking any medicine, including all over-the-counter and prescription medicine as well as vitamin and herbal supplements. It is possible to become pregnant while breastfeeding. If birth control is desired, ask your health care provider about options that will be safe   for your baby. Contact a health care provider if:  You feel like you want to stop breastfeeding or have become frustrated with breastfeeding.  You have painful breasts or nipples.  Your nipples are cracked or bleeding.  Your breasts are red, tender, or warm.  You have a swollen area on either breast.  You have a fever or chills.  You have nausea or vomiting.  You have drainage other than breast milk from your nipples.  Your breasts do not become full before feedings by the fifth day after you give  birth.  You feel sad and depressed.  Your baby is too sleepy to eat well.  Your baby is having trouble sleeping.  Your baby is wetting less than 3 diapers in a 24-hour period.  Your baby has less than 3 stools in a 24-hour period.  Your baby's skin or the white part of his or her eyes becomes yellow.  Your baby is not gaining weight by 5 days of age. Get help right away if:  Your baby is overly tired (lethargic) and does not want to wake up and feed.  Your baby develops an unexplained fever. This information is not intended to replace advice given to you by your health care provider. Make sure you discuss any questions you have with your health care provider. Document Released: 02/17/2005 Document Revised: 08/01/2015 Document Reviewed: 08/11/2012 Elsevier Interactive Patient Education  2017 [employer]  

## 2016-09-01 NOTE — Progress Notes (Signed)
DATING AND VIABILITY SONOGRAM   Christine Harris is a 31 y.o. year old [id number] with LMP Patient's last menstrual period was 06/10/2016 (exact date). which would correlate to 7523w6d weeks gestation.  She has regular menstrual cycles.   She is here today for a confirmatory initial sonogram.    GESTATION: 5337w1d SINGLETON:    yes  FETAL ACTIVITY:          Heart rate: 168          The fetus is active.     ADNEXA: The ovaries are not visualized.  GESTATIONAL AGE AND  BIOMETRICS:  Gestational criteria: Estimated Date of Delivery: 03/17/17 by now at 4223w6d   GESTATIONAL SAC           mm         weeks  CROWN RUMP LENGTH           mm         12 weeks 1 day                                                                               AVERAGE EGA(BY THIS SCAN):  4037w1d weeks  WORKING EDD 03/17/17         A copy of this report including all images has been saved and backed up to a second source for retrieval if needed. All measures and details of the anatomical scan, placentation, fluid volume and pelvic anatomy are contained in that report.  Dalphine Handingyvona L Tyniya Kuyper  09/01/2016 3:40 PM

## 2016-09-02 LAB — CERVICOVAGINAL ANCILLARY ONLY
BACTERIAL VAGINITIS: POSITIVE — AB
Candida vaginitis: NEGATIVE
Chlamydia: NEGATIVE
Neisseria Gonorrhea: NEGATIVE
Trichomonas: NEGATIVE

## 2016-09-04 ENCOUNTER — Other Ambulatory Visit: Payer: Self-pay | Admitting: Obstetrics and Gynecology

## 2016-09-04 ENCOUNTER — Encounter: Payer: Self-pay | Admitting: Obstetrics and Gynecology

## 2016-09-04 ENCOUNTER — Encounter: Payer: Self-pay | Admitting: *Deleted

## 2016-09-04 DIAGNOSIS — O26899 Other specified pregnancy related conditions, unspecified trimester: Secondary | ICD-10-CM

## 2016-09-04 DIAGNOSIS — Z6791 Unspecified blood type, Rh negative: Secondary | ICD-10-CM | POA: Insufficient documentation

## 2016-09-04 LAB — OBSTETRIC PANEL, INCLUDING HIV
Antibody Screen: NEGATIVE
BASOS: 0 %
Basophils Absolute: 0 10*3/uL (ref 0.0–0.2)
EOS (ABSOLUTE): 0.1 10*3/uL (ref 0.0–0.4)
Eos: 1 %
HEMATOCRIT: 34.1 % (ref 34.0–46.6)
HIV SCREEN 4TH GENERATION: NONREACTIVE
Hemoglobin: 11.6 g/dL (ref 11.1–15.9)
Hepatitis B Surface Ag: NEGATIVE
IMMATURE GRANS (ABS): 0 10*3/uL (ref 0.0–0.1)
Immature Granulocytes: 0 %
LYMPHS ABS: 1.6 10*3/uL (ref 0.7–3.1)
LYMPHS: 26 %
MCH: 31.4 pg (ref 26.6–33.0)
MCHC: 34 g/dL (ref 31.5–35.7)
MCV: 92 fL (ref 79–97)
MONOS ABS: 0.4 10*3/uL (ref 0.1–0.9)
Monocytes: 6 %
NEUTROS PCT: 67 %
Neutrophils Absolute: 4.2 10*3/uL (ref 1.4–7.0)
Platelets: 267 10*3/uL (ref 150–379)
RBC: 3.7 x10E6/uL — ABNORMAL LOW (ref 3.77–5.28)
RDW: 13 % (ref 12.3–15.4)
RH TYPE: NEGATIVE
RPR Ser Ql: NONREACTIVE
Rubella Antibodies, IGG: 9.09 index (ref >0.99)
WBC: 6.3 10*3/uL (ref 3.4–10.8)

## 2016-09-04 LAB — HEMOGLOBINOPATHY EVALUATION
HEMOGLOBIN A2 QUANTITATION: 2.7 % (ref 1.8–3.2)
HGB A: 93 % — AB (ref 96.4–98.8)
HGB C: 0 %
HGB S: 0 %
HGB VARIANT: 0 %
Hemoglobin F Quantitation: 4.3 % — ABNORMAL HIGH (ref 0.0–2.0)

## 2016-09-04 LAB — CYTOLOGY - PAP
Adequacy: ABSENT
Diagnosis: NEGATIVE

## 2016-09-04 LAB — URINE CULTURE, OB REFLEX

## 2016-09-04 LAB — VARICELLA ZOSTER ANTIBODY, IGG: Varicella zoster IgG: >4000 index (ref >165)

## 2016-09-04 LAB — CULTURE, OB URINE

## 2016-09-04 MED ORDER — CEPHALEXIN 500 MG PO CAPS: 500.0000 mg | ORAL_CAPSULE | Freq: Four times a day (QID) | ORAL | 0 refills | Status: DC

## 2016-09-04 MED ORDER — METRONIDAZOLE 500 MG PO TABS: 500.0000 mg | ORAL_TABLET | Freq: Two times a day (BID) | ORAL | 0 refills | Status: DC

## 2016-09-08 LAB — CYSTIC FIBROSIS MUTATION 97: Interpretation: NOT DETECTED

## 2016-09-10 ENCOUNTER — Emergency Department (HOSPITAL_BASED_OUTPATIENT_CLINIC_OR_DEPARTMENT_OTHER)
Admission: EM | Admit: 2016-09-10 | Discharge: 2016-09-10 | Disposition: A | Discharge status: Home / Self Care | Payer: Medicaid Other | Attending: Emergency Medicine | Admitting: Emergency Medicine

## 2016-09-10 ENCOUNTER — Encounter (HOSPITAL_BASED_OUTPATIENT_CLINIC_OR_DEPARTMENT_OTHER): Payer: Self-pay | Admitting: *Deleted

## 2016-09-10 DIAGNOSIS — O99712 Diseases of the skin and subcutaneous tissue complicating pregnancy, second trimester: Secondary | ICD-10-CM | POA: Diagnosis present

## 2016-09-10 DIAGNOSIS — N751 Abscess of Bartholin's gland: Secondary | ICD-10-CM | POA: Insufficient documentation

## 2016-09-10 DIAGNOSIS — Z3A13 13 weeks gestation of pregnancy: Secondary | ICD-10-CM | POA: Insufficient documentation

## 2016-09-10 DIAGNOSIS — Z79899 Other long term (current) drug therapy: Secondary | ICD-10-CM | POA: Insufficient documentation

## 2016-09-10 MED ORDER — LIDOCAINE-EPINEPHRINE (PF) 2 %-1:200000 IJ SOLN
10.0000 mL | Freq: Once | INTRAMUSCULAR | Status: AC
  Administered 2016-09-10: 10 mL
  Filled 2016-09-10: qty 10

## 2016-09-10 MED ORDER — CEPHALEXIN 500 MG PO CAPS: 500.0000 mg | ORAL_CAPSULE | Freq: Two times a day (BID) | ORAL | 0 refills | Status: DC

## 2016-09-10 NOTE — ED Provider Notes (Signed)
[facility] Provider Note   CSN: [id number] Arrival date & time: 09/10/16  2212  By signing my name below, I, Christine Harris, attest that this documentation has been prepared under the direction and in the presence of Christine HakeNicole Kartik Fernando, PA-C.  Electronically Signed: Rosario AdieWilliam Andrew Harris, ED Scribe. 09/10/16. 10:38 PM.  History   Chief Complaint Chief Complaint  Patient presents with  . Abscess   The history is provided by the patient. No language interpreter was used.    HPI Comments: Christine Harris is a 763P0020 31 y.o. female who is ~[redacted] weeks pregnant. who presents to the Emergency Department complaining of a moderate, gradually worsening area of pain and swelling to the right labia majora onset five days ago. Pt has a h/o similar which occurred one year ago and resolved following antibiotics and I&D. She has been placing warm compresses to the area at home w/o relief of her symptoms. No other treatments were tried at home. Pt states pain is exacerbated with palpation and direct pressure. She is currently followed by an OBGYN for her pre-natal care; she recently saw them three days ago, denies any complications with pregnancy, and she has another appointment with them at the end of the month. Pt also endorses some nausea during her pregnancy. No vomiting. Denies fever, chills, drainage from the area, abdominal pain, dysuria, vaginal d/c or any other associated ssymptoms.   Past Medical History:  Diagnosis Date  . Abscess    surgery consult 2003  . Bacterial vaginosis 12/05/2010  . Delayed menses 03/23/2013   6 week tab dec 5th .    Christine Harris. H/O varicella   . H/O: varicose veins   . Yeast infection    Patient Active Problem List   Diagnosis Date Noted  . Rh negative, antepartum 09/04/2016  . Encounter for supervision of normal pregnancy, unspecified, unspecified trimester 09/01/2016  . Visit for preventive health examination 06/07/2013  . Initiation of Depo Provera  06/07/2013  . Other general counseling and advice for contraceptive management 03/23/2013  . Breast swelling 03/23/2013  . Recurrent boils 10/29/2011  . Body piercing 10/29/2011  . Contraception management 10/29/2011   Past Surgical History:  Procedure Laterality Date  . HERNIA REPAIR  age 89  . TONSILLECTOMY  2010   OB History    Gravida Para Term Preterm AB Living   3 0 0 0 2 0   SAB TAB Ectopic Multiple Live Births   0 2 0 0 0      Obstetric Comments   Tab Feb 04 2013 failed condoms  About ? 6 weeks     Home Medications    Prior to Admission medications   Medication Sig Start Date End Date Taking? Authorizing Provider  cephALEXin (KEFLEX) 500 MG capsule Take 1 capsule (500 mg total) by mouth 2 (two) times daily. 09/10/16   [name], [name], PA-C  metroNIDAZOLE (FLAGYL) 500 MG tablet Take 1 tablet (500 mg total) by mouth 2 (two) times daily. 09/04/16   Constant, [name], MD  Prenatal Vit-Fe Fumarate-FA (MULTIVITAMIN-PRENATAL) 27-0.8 MG TABS tablet Take 1 tablet by mouth daily at 12 noon.    [provider]   Family History Family History  Problem Relation Age of Onset  . Diabetes Maternal Grandmother   . Colon cancer Maternal Grandmother   . Hyperlipidemia Maternal Grandmother   . Heart disease Maternal Grandmother   . Cancer Maternal Grandmother   . Diabetes Maternal Grandfather   . Asthma Paternal Grandmother   .  Heart disease Paternal Grandmother   . Hyperlipidemia Paternal Grandmother   . Stroke Paternal Grandmother   . Hypertension Mother   . Hypertension Father    Social History Social History  Substance Use Topics  . Smoking status: Never Smoker  . Smokeless tobacco: Never Used  . Alcohol use 0.0 oz/week   Allergies   Codeine  Review of Systems Review of Systems  Constitutional: Negative for chills and fever.  Gastrointestinal: Positive for nausea (pregnant). Negative for abdominal pain and vomiting.  Genitourinary: Negative for dysuria.    Skin:       +area of swelling and pain to right labia majora  All other systems reviewed and are negative.  Physical Exam Updated Vital Signs BP 118/79 (BP Location: Left Arm)   Pulse 89   Temp 98.3 F (36.8 C) (Oral)   Resp 16   Ht 5\' 2"  (1.575 m)   Wt 81.2 kg (179 lb)   LMP 06/10/2016 (Exact Date)   SpO2 100%   BMI 32.74 kg/m   Physical Exam  Constitutional: She is oriented to person, place, and time. She appears well-developed and well-nourished.  HENT:  Head: Normocephalic and atraumatic.  Eyes: Conjunctivae are normal. Right eye exhibits no discharge. Left eye exhibits no discharge. No scleral icterus.  Cardiovascular: Normal rate, regular rhythm, normal heart sounds and intact distal pulses.   Pulmonary/Chest: Effort normal and breath sounds normal.  Abdominal: Soft. Bowel sounds are normal. There is no tenderness.  Genitourinary:  Genitourinary Comments: Chaperone present throughout entire exam. 1x1.5cm area of swelling, induration, and fluctuance to the right labia majora with TTP. No surrounding erythema, warmth, swelling, or drainage.   Musculoskeletal: She exhibits no edema.  Neurological: She is alert and oriented to person, place, and time.  Skin: Skin is warm and dry.  Nursing note and vitals reviewed.  ED Treatments / Results  DIAGNOSTIC STUDIES: Oxygen Saturation is 100% on RA, normal by my interpretation.   COORDINATION OF CARE: 10:37 PM-Discussed next steps with pt. Pt verbalized understanding and is agreeable with the plan.   Labs (all labs ordered are listed, but only abnormal results are displayed) Labs Reviewed - No data to display  EKG  EKG Interpretation None      Radiology No results found.  Procedures .Christine KitchenIncision and Drainage Date/Time: 09/10/2016 11:10 PM Performed by: Barrett Henle Authorized by: Barrett Henle   Consent:    Consent obtained:  Verbal   Consent given by:  Patient Location:    Type:   Bartholin cyst   Size:  1x1.5cm   Location:  Anogenital   Anogenital location:  Bartholin's gland Pre-procedure details:    Skin preparation:  Betadine Anesthesia (see MAR for exact dosages):    Anesthesia method:  Local infiltration   Local anesthetic:  Lidocaine 2% WITH epi Procedure type:    Complexity:  Simple Procedure details:    Incision types:  Single straight   Incision depth:  Dermal   Scalpel blade:  11   Wound management:  Probed and deloculated and irrigated with saline   Drainage:  Bloody and purulent   Drainage amount:  Moderate   Wound treatment:  Wound left open   Packing materials:  None Post-procedure details:    Patient tolerance of procedure:  Tolerated well, no immediate complications     Medications Ordered in ED Medications  lidocaine-EPINEPHrine (XYLOCAINE W/EPI) 2 %-1:200000 (PF) injection 10 mL (10 mLs Infiltration Given 09/10/16 2312)    Initial Impression / Assessment  and Plan / ED Course  I have reviewed the triage vital signs and the nursing notes.  Pertinent labs & imaging results that were available during my care of the patient were reviewed by me and considered in my medical decision making (see chart for details).     Patient with bartholin's abscess amenable to incision and drainage.  Abscess was not large enough to warrant packing or drain,  wound recheck in 2 days. Encouraged home warm soaks and flushing.  Due to pt being [redacted] weeks pregnant and reporting hx of recurrent bartholin's abscesses, will start pt on abx.  Will d/c to home with abx, symptomatic tx and OB f/u. Discussed return precautions.     Final Clinical Impressions(s) / ED Diagnoses   Final diagnoses:  Bartholin's gland abscess   New Prescriptions New Prescriptions   CEPHALEXIN (KEFLEX) 500 MG CAPSULE    Take 1 capsule (500 mg total) by mouth 2 (two) times daily.   I personally performed the services described in this documentation, which was scribed in my presence.  The recorded information has been reviewed and is accurate.     Barrett Henle, PA-C 09/10/16 2312    Nira Conn, MD 09/11/16 Rich Fuchs

## 2016-09-10 NOTE — Discharge Instructions (Signed)
Take your antibiotic as prescribed until completed. I recommend continuing to apply warm compresses for 15-20 minutes 3-4 times daily. Follow-up with your OB at your next scheduled appointment. I recommend returning to the ED if symptoms worsen or new onset of fever, abdominal pain, vomiting, redness, swelling, warmth, vaginal bleeding or discharge.

## 2016-09-10 NOTE — ED Notes (Signed)
Attempted to obtain FHR without success 

## 2016-09-10 NOTE — ED Triage Notes (Addendum)
Pt c/o abscess to right labia x 5 days. Pt is 12 weeks preg

## 2016-09-29 ENCOUNTER — Ambulatory Visit (INDEPENDENT_AMBULATORY_CARE_PROVIDER_SITE_OTHER): Payer: Medicaid Other | Admitting: Obstetrics and Gynecology

## 2016-09-29 VITALS — BP 104/70 | HR 85 | Wt 177.0 lb

## 2016-09-29 DIAGNOSIS — O26899 Other specified pregnancy related conditions, unspecified trimester: Secondary | ICD-10-CM

## 2016-09-29 DIAGNOSIS — Z3482 Encounter for supervision of other normal pregnancy, second trimester: Secondary | ICD-10-CM

## 2016-09-29 DIAGNOSIS — Z3400 Encounter for supervision of normal first pregnancy, unspecified trimester: Secondary | ICD-10-CM

## 2016-09-29 DIAGNOSIS — Z6791 Unspecified blood type, Rh negative: Secondary | ICD-10-CM

## 2016-09-29 DIAGNOSIS — O09892 Supervision of other high risk pregnancies, second trimester: Secondary | ICD-10-CM

## 2016-09-29 NOTE — Addendum Note (Signed)
Addended by: Dalphine HandingGARDNER, Cystal Shannahan L on: 09/29/2016 04:51 PM   Modules accepted: Orders

## 2016-09-29 NOTE — Progress Notes (Signed)
   PRENATAL VISIT NOTE  Subjective:  Christine Harris is a 31 y.o. G3P0020 at 6149w6d being seen today for ongoing prenatal care.  She is currently monitored for the following issues for this low-risk pregnancy and has Recurrent boils; Body piercing; Breast swelling; Encounter for supervision of normal pregnancy, unspecified, unspecified trimester; and Rh negative, antepartum on her problem list.  Patient reports no complaints.  Contractions: Not present.  .  Movement: Absent. Denies leaking of fluid.   The following portions of the patient's history were reviewed and updated as appropriate: allergies, current medications, past family history, past medical history, past social history, past surgical history and problem list. Problem list updated.  Objective:   Vitals:   09/29/16 1454  BP: 104/70  Pulse: 85  Weight: 177 lb (80.3 kg)    Fetal Status: Fetal Heart Rate (bpm): 158   Movement: Absent     General:  Alert, oriented and cooperative. Patient is in no acute distress.  Skin: Skin is warm and dry. No rash noted.   Cardiovascular: Normal heart rate noted  Respiratory: Normal respiratory effort, no problems with respiration noted  Abdomen: Soft, gravid, appropriate for gestational age.  Pain/Pressure: Present     Pelvic: Cervical exam deferred        Extremities: Normal range of motion.  Edema: None  Mental Status:  Normal mood and affect. Normal behavior. Normal judgment and thought content.   Assessment and Plan:  Pregnancy: G3P0020 at 449w6d  1. Supervision of normal first pregnancy, antepartum Patient is doing well without complaints Quad screen today Anatomy ultrasound ordered - AFP, Quad Screen - US MFM OB COMP + 14 WK; Future  2. Rh negative, antepartum Rhogam at 28 weeks  Preterm labor symptoms and general obstetric precautions including but not limited to vaginal bleeding, contractions, leaking of fluid and fetal movement were reviewed in detail with the  patient. Please refer to After Visit Summary for other counseling recommendations.  Return in about 4 weeks (around 10/27/2016) for ROB.   Catalina AntiguaPeggy Angeni Chaudhuri, MD

## 2016-09-29 NOTE — Progress Notes (Signed)
104/70 85  

## 2016-10-01 ENCOUNTER — Telehealth: Payer: Self-pay

## 2016-10-01 NOTE — Telephone Encounter (Signed)
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 Her clinicals started yesterday. I did advise pt not to do any lifting over 15 lbs, no pulling, or pushing until I hear back from Dr. Jolayne Pantheronstant.

## 2016-10-02 NOTE — Telephone Encounter (Signed)
Letter provided. Pt informed ready for pick up at front desk.

## 2016-10-03 LAB — AFP TETRA
DIA Mom Value: 0.96
DIA Value (EIA): 150.13 pg/mL
DSR (By Age)    1 IN: 682
DSR (Second Trimester) 1 IN: 10000
Gestational Age: 15.9 WEEKS
MSAFP Mom: 1.8
MSAFP: 57.6 ng/mL
MSHCG Mom: 0.87
MSHCG: 31366 m[IU]/mL
Maternal Age At EDD: 30.2 yr
Osb Risk: 2555
T18 (By Age): 1:2657 {titer}
Test Results:: NEGATIVE
Weight: 177 [lb_av]
uE3 Mom: 1.67
uE3 Value: 1.17 ng/mL

## 2016-10-23 ENCOUNTER — Ambulatory Visit (HOSPITAL_COMMUNITY)
Admission: RE | Admit: 2016-10-23 | Discharge: 2016-10-23 | Disposition: A | Discharge status: Home / Self Care | Payer: Medicaid Other | Source: Ambulatory Visit | Attending: Obstetrics and Gynecology | Admitting: Obstetrics and Gynecology

## 2016-10-23 ENCOUNTER — Other Ambulatory Visit: Payer: Self-pay | Admitting: Obstetrics and Gynecology

## 2016-10-23 DIAGNOSIS — O99212 Obesity complicating pregnancy, second trimester: Secondary | ICD-10-CM | POA: Diagnosis not present

## 2016-10-23 DIAGNOSIS — Z363 Encounter for antenatal screening for malformations: Secondary | ICD-10-CM | POA: Diagnosis not present

## 2016-10-23 DIAGNOSIS — Z3A19 19 weeks gestation of pregnancy: Secondary | ICD-10-CM

## 2016-10-23 DIAGNOSIS — Z6832 Body mass index (BMI) 32.0-32.9, adult: Secondary | ICD-10-CM | POA: Diagnosis not present

## 2016-10-23 DIAGNOSIS — Z3400 Encounter for supervision of normal first pregnancy, unspecified trimester: Secondary | ICD-10-CM

## 2016-10-27 ENCOUNTER — Ambulatory Visit (INDEPENDENT_AMBULATORY_CARE_PROVIDER_SITE_OTHER): Payer: Medicaid Other | Admitting: Obstetrics and Gynecology

## 2016-10-27 VITALS — BP 114/68 | HR 93 | Wt 180.7 lb

## 2016-10-27 DIAGNOSIS — O26899 Other specified pregnancy related conditions, unspecified trimester: Principal | ICD-10-CM

## 2016-10-27 DIAGNOSIS — O09899 Supervision of other high risk pregnancies, unspecified trimester: Secondary | ICD-10-CM

## 2016-10-27 DIAGNOSIS — Z6791 Unspecified blood type, Rh negative: Secondary | ICD-10-CM

## 2016-10-27 DIAGNOSIS — Z3480 Encounter for supervision of other normal pregnancy, unspecified trimester: Secondary | ICD-10-CM

## 2016-10-27 NOTE — Patient Instructions (Signed)

## 2016-10-27 NOTE — Progress Notes (Signed)
Subjective:  Christine Harris is a 31 y.o. G3P0020 at [redacted]w[redacted]d being seen today for ongoing prenatal care.  She is currently monitored for the following issues for this low-risk pregnancy and has Recurrent boils; Body piercing; Encounter for supervision of normal pregnancy, unspecified, unspecified trimester; and Rh negative, antepartum on her problem list.  Patient reports constipation.  Contractions: Not present. Vag. Bleeding: None.  Movement: Present. Denies leaking of fluid.   The following portions of the patient's history were reviewed and updated as appropriate: allergies, current medications, past family history, past medical history, past social history, past surgical history and problem list. Problem list updated.  Objective:   Vitals:   10/27/16 1344  BP: 114/68  Pulse: 93  Weight: 180 lb 11.2 oz (82 kg)    Fetal Status: Fetal Heart Rate (bpm): 159   Movement: Present     General:  Alert, oriented and cooperative. Patient is in no acute distress.  Skin: Skin is warm and dry. No rash noted.   Cardiovascular: Normal heart rate noted  Respiratory: Normal respiratory effort, no problems with respiration noted  Abdomen: Soft, gravid, appropriate for gestational age. Pain/Pressure: Present     Pelvic:  Cervical exam deferred        Extremities: Normal range of motion.  Edema: None  Mental Status: Normal mood and affect. Normal behavior. Normal judgment and thought content.   Urinalysis:      Assessment and Plan:  Pregnancy: G3P0020 at [redacted]w[redacted]d  1. Rh negative, antepartum Rhogam at 28 weeks  2. Supervision of other normal pregnancy, antepartum Stable Stool softener PRN Increase hydration - [place] MFM OB FOLLOW UP; Future  Preterm labor symptoms and general obstetric precautions including but not limited to vaginal bleeding, contractions, leaking of fluid and fetal movement were reviewed in detail with the patient. Please refer to After Visit Summary for other counseling  recommendations.  Return in about 4 weeks (around 11/24/2016) for OB visit.   Hermina Staggers, MD

## 2016-11-24 ENCOUNTER — Encounter: Payer: Medicaid Other | Admitting: Obstetrics and Gynecology

## 2016-11-27 ENCOUNTER — Encounter: Payer: Self-pay | Admitting: Obstetrics

## 2016-11-27 ENCOUNTER — Ambulatory Visit (INDEPENDENT_AMBULATORY_CARE_PROVIDER_SITE_OTHER): Payer: Medicaid Other | Admitting: Obstetrics & Gynecology

## 2016-11-27 ENCOUNTER — Ambulatory Visit (HOSPITAL_COMMUNITY)
Admission: RE | Admit: 2016-11-27 | Discharge: 2016-11-27 | Disposition: A | Discharge status: Home / Self Care | Payer: Medicaid Other | Source: Ambulatory Visit | Attending: Obstetrics and Gynecology | Admitting: Obstetrics and Gynecology

## 2016-11-27 DIAGNOSIS — Z3480 Encounter for supervision of other normal pregnancy, unspecified trimester: Secondary | ICD-10-CM

## 2016-11-27 DIAGNOSIS — O26892 Other specified pregnancy related conditions, second trimester: Secondary | ICD-10-CM | POA: Insufficient documentation

## 2016-11-27 DIAGNOSIS — Z6791 Unspecified blood type, Rh negative: Secondary | ICD-10-CM | POA: Diagnosis not present

## 2016-11-27 DIAGNOSIS — M5430 Sciatica, unspecified side: Secondary | ICD-10-CM | POA: Insufficient documentation

## 2016-11-27 DIAGNOSIS — M5432 Sciatica, left side: Secondary | ICD-10-CM

## 2016-11-27 DIAGNOSIS — Z362 Encounter for other antenatal screening follow-up: Secondary | ICD-10-CM | POA: Insufficient documentation

## 2016-11-27 DIAGNOSIS — Z3A24 24 weeks gestation of pregnancy: Secondary | ICD-10-CM | POA: Diagnosis not present

## 2016-11-27 DIAGNOSIS — O99212 Obesity complicating pregnancy, second trimester: Secondary | ICD-10-CM | POA: Insufficient documentation

## 2016-11-27 DIAGNOSIS — Z3402 Encounter for supervision of normal first pregnancy, second trimester: Secondary | ICD-10-CM

## 2016-11-27 DIAGNOSIS — Z3400 Encounter for supervision of normal first pregnancy, unspecified trimester: Secondary | ICD-10-CM

## 2016-11-27 NOTE — Patient Instructions (Addendum)
Second Trimester of Pregnancy The second trimester is from week 14 through week 27 (months 4 through 6). The second trimester is often a time when you feel your best. Your body has adjusted to being pregnant, and you begin to feel better physically. Usually, morning sickness has lessened or quit completely, you may have more energy, and you may have an increase in appetite. The second trimester is also a time when the fetus is growing rapidly. At the end of the sixth month, the fetus is about 9 inches long and weighs about 1 pounds. You will likely begin to feel the baby move (quickening) between 16 and 20 weeks of pregnancy. Body changes during your second trimester Your body continues to go through many changes during your second trimester. The changes vary from woman to woman.  Your weight will continue to increase. You will notice your lower abdomen bulging out.  You may begin to get stretch marks on your hips, abdomen, and breasts.  You may develop headaches that can be relieved by medicines. The medicines should be approved by your health care provider.  You may urinate more often because the fetus is pressing on your bladder.  You may develop or continue to have heartburn as a result of your pregnancy.  You may develop constipation because certain hormones are causing the muscles that push waste through your intestines to slow down.  You may develop hemorrhoids or swollen, bulging veins (varicose veins).  You may have back pain. This is caused by: ? Weight gain. ? Pregnancy hormones that are relaxing the joints in your pelvis. ? A shift in weight and the muscles that support your balance.  Your breasts will continue to grow and they will continue to become tender.  Your gums may bleed and may be sensitive to brushing and flossing.  Dark spots or blotches (chloasma, mask of pregnancy) may develop on your face. This will likely fade after the baby is born.  A dark line from your  belly button to the pubic area (linea nigra) may appear. This will likely fade after the baby is born.  You may have changes in your hair. These can include thickening of your hair, rapid growth, and changes in texture. Some women also have hair loss during or after pregnancy, or hair that feels dry or thin. Your hair will most likely return to normal after your baby is born.  What to expect at prenatal visits During a routine prenatal visit:  You will be weighed to make sure you and the fetus are growing normally.  Your blood pressure will be taken.  Your abdomen will be measured to track your baby's growth.  The fetal heartbeat will be listened to.  Any test results from the previous visit will be discussed.  Your health care provider may ask you:  How you are feeling.  If you are feeling the baby move.  If you have had any abnormal symptoms, such as leaking fluid, bleeding, severe headaches, or abdominal cramping.  If you are using any tobacco products, including cigarettes, chewing tobacco, and electronic cigarettes.  If you have any questions.  Other tests that may be performed during your second trimester include:  Blood tests that check for: ? Low iron levels (anemia). ? High blood sugar that affects pregnant women (gestational diabetes) between 24 and 28 weeks. ? Rh antibodies. This is to check for a protein on red blood cells (Rh factor).  Urine tests to check for infections, diabetes, or   protein in the urine.  An ultrasound to confirm the proper growth and development of the baby.  An amniocentesis to check for possible genetic problems.  Fetal screens for spina bifida and Down syndrome.  HIV (human immunodeficiency virus) testing. Routine prenatal testing includes screening for HIV, unless you choose not to have this test.  Follow these instructions at home: Medicines  Follow your health care provider's instructions regarding medicine use. Specific  medicines may be either safe or unsafe to take during pregnancy.  Take a prenatal vitamin that contains at least 600 micrograms (mcg) of folic acid.  If you develop constipation, try taking a stool softener if your health care provider approves. Eating and drinking  Eat a balanced diet that includes fresh fruits and vegetables, whole grains, good sources of protein such as meat, eggs, or tofu, and low-fat dairy. Your health care provider will help you determine the amount of weight gain that is right for you.  Avoid raw meat and uncooked cheese. These carry germs that can cause birth defects in the baby.  If you have low calcium intake from food, talk to your health care provider about whether you should take a daily calcium supplement.  Limit foods that are high in fat and processed sugars, such as fried and sweet foods.  To prevent constipation: ? Drink enough fluid to keep your urine clear or pale yellow. ? Eat foods that are high in fiber, such as fresh fruits and vegetables, whole grains, and beans. Activity  Exercise only as directed by your health care provider. Most women can continue their usual exercise routine during pregnancy. Try to exercise for 30 minutes at least 5 days a week. Stop exercising if you experience uterine contractions.  Avoid heavy lifting, wear low heel shoes, and practice good posture.  A sexual relationship may be continued unless your health care provider directs you otherwise. Relieving pain and discomfort  Wear a good support bra to prevent discomfort from breast tenderness.  Take warm sitz baths to soothe any pain or discomfort caused by hemorrhoids. Use hemorrhoid cream if your health care provider approves.  Rest with your legs elevated if you have leg cramps or low back pain.  If you develop varicose veins, wear support hose. Elevate your feet for 15 minutes, 3-4 times a day. Limit salt in your diet. Prenatal Care  Write down your questions.  Take them to your prenatal visits.  Keep all your prenatal visits as told by your health care provider. This is important. Safety  Wear your seat belt at all times when driving.  Make a list of emergency phone numbers, including numbers for family, friends, the hospital, and police and fire departments. General instructions  Ask your health care provider for a referral to a local prenatal education class. Begin classes no later than the beginning of month 6 of your pregnancy.  Ask for help if you have counseling or nutritional needs during pregnancy. Your health care provider can offer advice or refer you to specialists for help with various needs.  Do not use hot tubs, steam rooms, or saunas.  Do not douche or use tampons or scented sanitary pads.  Do not cross your legs for long periods of time.  Avoid cat litter boxes and soil used by cats. These carry germs that can cause birth defects in the baby and possibly loss of the fetus by miscarriage or stillbirth.  Avoid all smoking, herbs, alcohol, and unprescribed drugs. Chemicals in these products can   affect the formation and growth of the baby.  Do not use any products that contain nicotine or tobacco, such as cigarettes and e-cigarettes. If you need help quitting, ask your health care provider.  Visit your dentist if you have not gone yet during your pregnancy. Use a soft toothbrush to brush your teeth and be gentle when you floss. Contact a health care provider if:  You have dizziness.  You have mild pelvic cramps, pelvic pressure, or nagging pain in the abdominal area.  You have persistent nausea, vomiting, or diarrhea.  You have a bad smelling vaginal discharge.  You have pain when you urinate. Get help right away if:  You have a fever.  You are leaking fluid from your vagina.  You have spotting or bleeding from your vagina.  You have severe abdominal cramping or pain.  You have rapid weight gain or weight  loss.  You have shortness of breath with chest pain.  You notice sudden or extreme swelling of your face, hands, ankles, feet, or legs.  You have not felt your baby move in over an hour.  You have severe headaches that do not go away when you take medicine.  You have vision changes. Summary  The second trimester is from week 14 through week 27 (months 4 through 6). It is also a time when the fetus is growing rapidly.  Your body goes through many changes during pregnancy. The changes vary from woman to woman.  Avoid all smoking, herbs, alcohol, and unprescribed drugs. These chemicals affect the formation and growth your baby.  Do not use any tobacco products, such as cigarettes, chewing tobacco, and e-cigarettes. If you need help quitting, ask your health care provider.  Contact your health care provider if you have any questions. Keep all prenatal visits as told by your health care provider. This is important. This information is not intended to replace advice given to you by your health care provider. Make sure you discuss any questions you have with your health care provider. Document Released: 02/11/2001 Document Revised: 07/26/2015 Document Reviewed: 04/20/2012 Elsevier Interactive Patient Education  2017 [employer]  Piriformis Syndrome Rehab Ask your health care provider which exercises are safe for you. Do exercises exactly as told by your health care provider and adjust them as directed. It is normal to feel mild stretching, pulling, tightness, or discomfort as you do these exercises, but you should stop right away if you feel sudden pain or your pain gets worse.Do not begin these exercises until told by your health care provider. Stretching and range of motion exercises These exercises warm up your muscles and joints and improve the movement and flexibility of your hip and pelvis. These exercises also help to relieve pain, numbness, and tingling. Exercise A: Hip  rotators  Lie on your back on a firm surface. Pull your left / right knee toward your same shoulder with your left / right hand until your knee is pointing toward the ceiling. Hold your left / right ankle with your other hand. Keeping your knee steady, gently pull your left / right ankle toward your other shoulder until you feel a stretch in your buttocks. Hold this position for __________ seconds. Repeat __________ times. Complete this stretch __________ times a day. Exercise B: Hip extensors Lie on your back on a firm surface. Both of your legs should be straight. Pull your left / right knee to your chest. Hold your leg in this position by holding onto the back of  your thigh or the front of your knee. Hold this position for __________ seconds. Slowly return to the starting position. Repeat __________ times. Complete this stretch __________ times a day. Strengthening exercises These exercises build strength and endurance in your hip and thigh muscles. Endurance is the ability to use your muscles for a long time, even after they get tired. Exercise C: Straight leg raises ( hip abductors) Lie on your side with your left / right leg in the top position. Lie so your head, shoulder, knee, and hip line up. Bend your bottom knee to help you balance. Lift your top leg up 4-6 inches (10-15 cm), keeping your toes pointed straight ahead. Hold this position for __________ seconds. Slowly lower your leg to the starting position. Let your muscles relax completely. Repeat __________ times. Complete this exercise__________ times a day. Exercise D: Hip abductors and rotators, quadruped  Get on your hands and knees on a firm, lightly padded surface. Your hands should be directly below your shoulders, and your knees should be directly below your hips. Lift your left / right knee out to the side. Keep your knee bent. Do not twist your body. Hold this position for __________ seconds. Slowly lower your  leg. Repeat __________ times. Complete this exercise__________ times a day. Exercise E: Straight leg raises ( hip extensors) Lie on your abdomen on a bed or a firm surface with a pillow under your hips. Squeeze your buttock muscles and lift your left / right thigh off the bed. Do not let your back arch. Hold this position for __________ seconds. Slowly return to the starting position. Let your muscles relax completely before doing another repetition. Repeat __________ times. Complete this exercise__________ times a day. This information is not intended to replace advice given to you by your health care provider. Make sure you discuss any questions you have with your health care provider. Document Released: 02/17/2005 Document Revised: 10/23/2015 Document Reviewed: 01/30/2015 Elsevier Interactive Patient Education  2018 [employer]  Sciatica Sciatica is pain, numbness, weakness, or tingling along the path of the sciatic nerve. The sciatic nerve starts in the lower back and runs down the back of each leg. The nerve controls the muscles in the lower leg and in the back of the knee. It also provides feeling (sensation) to the back of the thigh, the lower leg, and the sole of the foot. Sciatica is a symptom of another medical condition that pinches or puts pressure on the sciatic nerve. Generally, sciatica only affects one side of the body. Sciatica usually goes away on its own or with treatment. In some cases, sciatica may keep coming back (recur). What are the causes? This condition is caused by pressure on the sciatic nerve, or pinching of the sciatic nerve. This may be the result of:  A disk in between the bones of the spine (vertebrae) bulging out too far (herniated disk).  Age-related changes in the spinal disks (degenerative disk disease).  A pain disorder that affects a muscle in the buttock (piriformis syndrome).  Extra bone growth (bone spur) near the sciatic nerve.  An injury or  break (fracture) of the pelvis.  Pregnancy.  Tumor (rare).  What increases the risk? The following factors may make you more likely to develop this condition:  Playing sports that place pressure or stress on the spine, such as football or weight lifting.  Having poor strength and flexibility.  A history of back injury.  A history of back surgery.  Sitting for  long periods of time.  Doing activities that involve repetitive bending or lifting.  Obesity.  What are the signs or symptoms? Symptoms can vary from mild to very severe, and they may include:  Any of these problems in the lower back, leg, hip, or buttock: ? Mild tingling or dull aches. ? Burning sensations. ? Sharp pains.  Numbness in the back of the calf or the sole of the foot.  Leg weakness.  Severe back pain that makes movement difficult.  These symptoms may get worse when you cough, sneeze, or laugh, or when you sit or stand for long periods of time. Being overweight may also make symptoms worse. In some cases, symptoms may recur over time. How is this diagnosed? This condition may be diagnosed based on:  Your symptoms.  A physical exam. Your health care provider may ask you to do certain movements to check whether those movements trigger your symptoms.  You may have tests, including: ? Blood tests. ? X-rays. ? MRI. ? CT scan.  How is this treated? In many cases, this condition improves on its own, without any treatment. However, treatment may include:  Reducing or modifying physical activity during periods of pain.  Exercising and stretching to strengthen your abdomen and improve the flexibility of your spine.  Icing and applying heat to the affected area.  Medicines that help: ? To relieve pain and swelling. ? To relax your muscles.  Injections of medicines that help to relieve pain, irritation, and inflammation around the sciatic nerve (steroids).  Surgery.  Follow these instructions  at home: Medicines  Take over-the-counter and prescription medicines only as told by your health care provider.  Do not drive or operate heavy machinery while taking prescription pain medicine. Managing pain  If directed, apply ice to the affected area. ? Put ice in a plastic bag. ? Place a towel between your skin and the bag. ? Leave the ice on for 20 minutes, 2-3 times a day.  After icing, apply heat to the affected area before you exercise or as often as told by your health care provider. Use the heat source that your health care provider recommends, such as a moist heat pack or a heating pad. ? Place a towel between your skin and the heat source. ? Leave the heat on for 20-30 minutes. ? Remove the heat if your skin turns bright red. This is especially important if you are unable to feel pain, heat, or cold. You may have a greater risk of getting burned. Activity  Return to your normal activities as told by your health care provider. Ask your health care provider what activities are safe for you. ? Avoid activities that make your symptoms worse.  Take brief periods of rest throughout the day. Resting in a lying or standing position is usually better than sitting to rest. ? When you rest for longer periods, mix in some mild activity or stretching between periods of rest. This will help to prevent stiffness and pain. ? Avoid sitting for long periods of time without moving. Get up and move around at least one time each hour.  Exercise and stretch regularly, as told by your health care provider.  Do not lift anything that is heavier than 10 lb (4.5 kg) while you have symptoms of sciatica. When you do not have symptoms, you should still avoid heavy lifting, especially repetitive heavy lifting.  When you lift objects, always use proper lifting technique, which includes: ? Bending your knees. ?  Keeping the load close to your body. ? Avoiding twisting. General instructions  Use good  posture. ? Avoid leaning forward while sitting. ? Avoid hunching over while standing.  Maintain a healthy weight. Excess weight puts extra stress on your back and makes it difficult to maintain good posture.  Wear supportive, comfortable shoes. Avoid wearing high heels.  Avoid sleeping on a mattress that is too soft or too hard. A mattress that is firm enough to support your back when you sleep may help to reduce your pain.  Keep all follow-up visits as told by your health care provider. This is important. Contact a health care provider if:  You have pain that wakes you up when you are sleeping.  You have pain that gets worse when you lie down.  Your pain is worse than you have experienced in the past.  Your pain lasts longer than 4 weeks.  You experience unexplained weight loss. Get help right away if:  You lose control of your bowel or bladder (incontinence).  You have: ? Weakness in your lower back, pelvis, buttocks, or legs that gets worse. ? Redness or swelling of your back. ? A burning sensation when you urinate. This information is not intended to replace advice given to you by your health care provider. Make sure you discuss any questions you have with your health care provider. Document Released: 02/11/2001 Document Revised: 07/24/2015 Document Reviewed: 10/27/2014 Elsevier Interactive Patient Education  2017 [employer].

## 2016-11-27 NOTE — Progress Notes (Signed)
Pt states that she is having SOB while lying down. Pt advised to prop up at night when lying. Pt also complaints of Sciatic pain.

## 2016-11-27 NOTE — Progress Notes (Signed)
   PRENATAL VISIT NOTE  Subjective:  Christine Harris is a 31 y.o. G3P0020 at [redacted]w[redacted]d being seen today for ongoing prenatal care.  She is currently monitored for the following issues for this low-risk pregnancy and has Recurrent boils; Body piercing; Supervision of normal first pregnancy, antepartum; Rh negative, antepartum; and Sciatic nerve pain on her problem list.  Patient reports left low back and leg pain, feels SOB when she lies down.  Contractions: Not present. Vag. Bleeding: None.  Movement: Present. Denies leaking of fluid.   The following portions of the patient's history were reviewed and updated as appropriate: allergies, current medications, past family history, past medical history, past social history, past surgical history and problem list. Problem list updated.  Objective:   Vitals:   11/27/16 1544  BP: 101/66  Pulse: 78  Weight: 183 lb (83 kg)    Fetal Status:     Movement: Present     General:  Alert, oriented and cooperative. Patient is in no acute distress.  Skin: Skin is warm and dry. No rash noted.   Cardiovascular: Normal heart rate noted  Respiratory: Normal respiratory effort, no problems with respiration noted CTA  Abdomen: Soft, gravid, appropriate for gestational age.  Pain/Pressure: Present     Pelvic: Cervical exam deferred        Extremities: Normal range of motion.  Edema: None  Mental Status:  Normal mood and affect. Normal behavior. Normal judgment and thought content.   Assessment and Plan:  Pregnancy: G3P0020 at [redacted]w[redacted]d  1. Supervision of normal first pregnancy, antepartum Positional respiratory sx, try propping up on pillows to avoid sx, report if worse  2. Left sciatic nerve pain Instructions for exercise given  Preterm labor symptoms and general obstetric precautions including but not limited to vaginal bleeding, contractions, leaking of fluid and fetal movement were reviewed in detail with the patient. Please refer to After Visit Summary  for other counseling recommendations.  Return in about 4 weeks (around 12/25/2016) for 2 hr GTT. Refer to Dr Adrian Blackwater to evaluate sciatic pain  Scheryl Darter, MD

## 2016-12-04 ENCOUNTER — Encounter (HOSPITAL_BASED_OUTPATIENT_CLINIC_OR_DEPARTMENT_OTHER): Payer: Self-pay | Admitting: *Deleted

## 2016-12-04 ENCOUNTER — Emergency Department (HOSPITAL_BASED_OUTPATIENT_CLINIC_OR_DEPARTMENT_OTHER)
Admission: EM | Admit: 2016-12-04 | Discharge: 2016-12-05 | Disposition: A | Discharge status: Home / Self Care | Payer: Medicaid Other | Attending: Emergency Medicine | Admitting: Emergency Medicine

## 2016-12-04 DIAGNOSIS — Z3492 Encounter for supervision of normal pregnancy, unspecified, second trimester: Secondary | ICD-10-CM | POA: Diagnosis not present

## 2016-12-04 DIAGNOSIS — Z79899 Other long term (current) drug therapy: Secondary | ICD-10-CM | POA: Diagnosis not present

## 2016-12-04 DIAGNOSIS — K610 Anal abscess: Secondary | ICD-10-CM | POA: Diagnosis not present

## 2016-12-04 DIAGNOSIS — Z3A25 25 weeks gestation of pregnancy: Secondary | ICD-10-CM | POA: Insufficient documentation

## 2016-12-04 DIAGNOSIS — O99712 Diseases of the skin and subcutaneous tissue complicating pregnancy, second trimester: Secondary | ICD-10-CM | POA: Insufficient documentation

## 2016-12-04 NOTE — ED Triage Notes (Signed)
She is [redacted] weeks pregnant. Here for abscess to inside her buttocks x 4 days.

## 2016-12-05 MED ORDER — LIDOCAINE HCL (PF) 1 % IJ SOLN
10.0000 mL | Freq: Once | INTRAMUSCULAR | Status: AC
  Administered 2016-12-05: 10 mL

## 2016-12-05 NOTE — Discharge Instructions (Signed)
Take acetaminophen as needed for pain. Do not take ibuprofen or naproxen because they can hurt the baby. Return if you are having any problems.

## 2016-12-05 NOTE — ED Provider Notes (Signed)
[facility] Provider Note   CSN: [id number] Arrival date & time: 12/04/16  2219     History   Chief Complaint Chief Complaint  Patient presents with  . Abscess    HPI Christine Harris is a 31 y.o. female.  The history is provided by the patient.  She is 25 weeks 3 days pregnant and comes in complaining of pain in the right gluteal area for the last 3 days. There has been some swelling there. She has a history of recurrent boils. She denies fever or chills. There've been no problems with pregnancy and she has noted normal fetal movement.  Past Medical History:  Diagnosis Date  . Abscess    surgery consult 2003  . Bacterial vaginosis 12/05/2010  . Delayed menses 03/23/2013   6 week tab dec 5th .    [name] H/O varicella   . H/O: varicose veins   . Yeast infection     Patient Active Problem List   Diagnosis Date Noted  . Sciatic nerve pain 11/27/2016  . Rh negative, antepartum 09/04/2016  . Supervision of normal first pregnancy, antepartum 09/01/2016  . Recurrent boils 10/29/2011  . Body piercing 10/29/2011    Past Surgical History:  Procedure Laterality Date  . HERNIA REPAIR  age 28  . TONSILLECTOMY  2010    OB History    Gravida Para Term Preterm AB Living   3 0 0 0 2 0   SAB TAB Ectopic Multiple Live Births   0 2 0 0 0      Obstetric Comments   Tab Feb 04 2013 failed condoms  About ? 6 weeks       Home Medications    Prior to Admission medications   Medication Sig Start Date End Date Taking? Authorizing Provider  Prenatal Vit-Fe Fumarate-FA (MULTIVITAMIN-PRENATAL) 27-0.8 MG TABS tablet Take 1 tablet by mouth daily at 12 noon.    [provider]    Family History Family History  Problem Relation Age of Onset  . Diabetes Maternal Grandmother   . Colon cancer Maternal Grandmother   . Hyperlipidemia Maternal Grandmother   . Heart disease Maternal Grandmother   . Cancer Maternal Grandmother   . Diabetes Maternal Grandfather   .  Asthma Paternal Grandmother   . Heart disease Paternal Grandmother   . Hyperlipidemia Paternal Grandmother   . Stroke Paternal Grandmother   . Hypertension Mother   . Hypertension Father     Social History Social History  Substance Use Topics  . Smoking status: Never Smoker  . Smokeless tobacco: Never Used  . Alcohol use 0.0 oz/week     Allergies   Codeine   Review of Systems Review of Systems  All other systems reviewed and are negative.    Physical Exam Updated Vital Signs BP 102/61 (BP Location: Right Arm)   Pulse 70   Temp 98.2 F (36.8 C) (Oral)   Resp 16   Ht  (1.575 m)   Wt 83 kg (183 lb)   LMP 06/10/2016 (Exact Date)   SpO2 97%   BMI 33.47 kg/m   Physical Exam  Nursing note and vitals reviewed.  31 year old female, resting comfortably and in no acute distress. Vital signs are normal. Oxygen saturation is 97%, which is normal. Head is normocephalic and atraumatic. PERRLA, EOMI. Oropharynx is clear. Neck is nontender and supple without adenopathy or JVD. Back is nontender and there is no CVA tenderness. Lungs are clear without rales, wheezes, or  rhonchi. Chest is nontender. Heart has regular rate and rhythm without murmur. Abdomen is soft, flat, nontender. Gravid uterus present which is nontender, size consistent with dates. There are no other masses or hepatosplenomegaly and peristalsis is normoactive. Rectal: Right perirectal abscess present. Extremities have trace edema, full range of motion is present. Skin is warm and dry without rash. Neurologic: Mental status is normal, cranial nerves are intact, there are no motor or sensory deficits.  ED Treatments / Results   Procedures Procedures (including critical care time) INCISION AND DRAINAGE Performed by: ZOXWR,UEAVW Consent: Verbal consent obtained. Risks and benefits: risks, benefits and alternatives were discussed Type: abscess  Body area: Perianal   Anesthesia: local  infiltration  Incision was made with a scalpel.  Local anesthetic: lidocaine 1% without epinephrine  Anesthetic total: 3 ml  Complexity: complex Blunt dissection to break up loculations  Drainage: purulent  Drainage amount: moderate  Packing material: None  Patient tolerance: Patient tolerated the procedure well with no immediate complications.     Medications Ordered in ED Medications  lidocaine (PF) (XYLOCAINE) 1 % injection 10 mL (10 mLs Infiltration Given 12/05/16 0109)     Initial Impression / Assessment and Plan / ED Course  I have reviewed the triage vital signs and the nursing notes.  Right perirectal abscess tube with incision and drainage. Old records are reviewed confirming prenatal care, prior ED visits for abscesses.  Final Clinical Impressions(s) / ED Diagnoses   Final diagnoses:  Perianal abscess  Second trimester pregnancy    New Prescriptions New Prescriptions   No medications on file     Dione Booze, MD 12/05/16 0202

## 2016-12-05 NOTE — ED Notes (Signed)
ED Provider at bedside. 

## 2016-12-22 ENCOUNTER — Ambulatory Visit (INDEPENDENT_AMBULATORY_CARE_PROVIDER_SITE_OTHER): Payer: Medicaid Other | Admitting: Family Medicine

## 2016-12-22 VITALS — BP 99/56 | HR 74 | Wt 187.0 lb

## 2016-12-22 DIAGNOSIS — M9904 Segmental and somatic dysfunction of sacral region: Secondary | ICD-10-CM

## 2016-12-22 DIAGNOSIS — M9903 Segmental and somatic dysfunction of lumbar region: Secondary | ICD-10-CM | POA: Diagnosis not present

## 2016-12-22 DIAGNOSIS — O9989 Other specified diseases and conditions complicating pregnancy, childbirth and the puerperium: Secondary | ICD-10-CM

## 2016-12-22 DIAGNOSIS — Z3482 Encounter for supervision of other normal pregnancy, second trimester: Secondary | ICD-10-CM

## 2016-12-22 DIAGNOSIS — M5432 Sciatica, left side: Secondary | ICD-10-CM | POA: Diagnosis not present

## 2016-12-22 DIAGNOSIS — Z3400 Encounter for supervision of normal first pregnancy, unspecified trimester: Secondary | ICD-10-CM

## 2016-12-22 NOTE — Progress Notes (Signed)
   PRENATAL VISIT NOTE  Subjective:  Christine Harris is a 31 y.o. G3P0020 at 461w6d being seen today for ongoing prenatal care.  She is currently monitored for the following issues for this low-risk pregnancy and has Recurrent boils; Body piercing; Supervision of normal first pregnancy, antepartum; Rh negative, antepartum; and Sciatic nerve pain on her problem list.  Patient reports back pain, left lower back. Worse with movements. Had prior to pregnancy, but worse now. Worse with transitions from sitting, laying down to standing. Radiation down left leg.  Contractions: Not present. Vag. Bleeding: None.  Movement: Present. Denies leaking of fluid.   The following portions of the patient's history were reviewed and updated as appropriate: allergies, current medications, past family history, past medical history, past social history, past surgical history and problem list. Problem list updated.  Objective:   Vitals:   12/22/16 1027  BP: (!) 99/56  Pulse: 74  Weight: 187 lb (84.8 kg)    Fetal Status: Fetal Heart Rate (bpm): 145   Movement: Present     General:  Alert, oriented and cooperative. Patient is in no acute distress.  Skin: Skin is warm and dry. No rash noted.   Cardiovascular: Normal heart rate noted  Respiratory: Normal respiratory effort, no problems with respiration noted  Abdomen: Soft, gravid, appropriate for gestational age. Pain/Pressure: Present     Pelvic:  Cervical exam deferred        MSK: Restriction, tenderness, tissue texture changes, and paraspinal spasm in the left lumbar spine  Neuro: Moves all four extremities with no focal neurological deficit  Extremities: Normal range of motion.  Edema: None  Mental Status: Normal mood and affect. Normal behavior. Normal judgment and thought content.   OSE: Head   Cervical   Thoracic   Rib   Lumbar L5 ESRL, L4 ESRR  Sacrum L/L torsion  Pelvis Right ant innom    Assessment and Plan:  Pregnancy: G3P0020 at  5961w6d  1. Supervision of normal first pregnancy, antepartum FHT normal  2. Left sciatic nerve pain 3. Somatic dysfunction of lumbar region 4. Somatic dysfunction of sacral region OMT done after patient permission. HVLA technique utilized. Patient tolerated procedure well. Stretches taught and demonstrated.  Preterm labor symptoms and general obstetric precautions including but not limited to vaginal bleeding, contractions, leaking of fluid and fetal movement were reviewed in detail with the patient. Please refer to After Visit Summary for other counseling recommendations.  No Follow-up on file.  Levie HeritageStinson, Nazario Russom J, DO

## 2016-12-25 ENCOUNTER — Other Ambulatory Visit: Payer: Medicaid Other

## 2016-12-25 ENCOUNTER — Ambulatory Visit (INDEPENDENT_AMBULATORY_CARE_PROVIDER_SITE_OTHER): Payer: Medicaid Other | Admitting: Obstetrics and Gynecology

## 2016-12-25 VITALS — BP 110/66 | HR 91 | Wt 190.0 lb

## 2016-12-25 DIAGNOSIS — Z6791 Unspecified blood type, Rh negative: Secondary | ICD-10-CM

## 2016-12-25 DIAGNOSIS — Z23 Encounter for immunization: Secondary | ICD-10-CM

## 2016-12-25 DIAGNOSIS — Z3483 Encounter for supervision of other normal pregnancy, third trimester: Secondary | ICD-10-CM

## 2016-12-25 DIAGNOSIS — O26899 Other specified pregnancy related conditions, unspecified trimester: Secondary | ICD-10-CM

## 2016-12-25 DIAGNOSIS — Z3400 Encounter for supervision of normal first pregnancy, unspecified trimester: Secondary | ICD-10-CM

## 2016-12-25 MED ORDER — RHO D IMMUNE GLOBULIN 1500 UNIT/2ML IJ SOSY
300.0000 ug | PREFILLED_SYRINGE | Freq: Once | INTRAMUSCULAR | Status: AC
  Administered 2016-12-25: 300 ug via INTRAMUSCULAR

## 2016-12-25 MED ORDER — COMFORT FIT MATERNITY SUPP MED MISC
0 refills | Status: DC
Start: 2016-12-25 — End: 2017-03-03

## 2016-12-25 NOTE — Progress Notes (Signed)
   PRENATAL VISIT NOTE  Subjective:  Christine Harris is a 31 y.o. G3P0020 at 1568w2d being seen today for ongoing prenatal care.  She is currently monitored for the following issues for this low-risk pregnancy and has Recurrent boils; Body piercing; Supervision of normal first pregnancy, antepartum; Rh negative, antepartum; and Sciatic nerve pain on her problem list.  Patient reports no complaints.  Contractions: Not present. Vag. Bleeding: None.  Movement: Present. Denies leaking of fluid.   The following portions of the patient's history were reviewed and updated as appropriate: allergies, current medications, past family history, past medical history, past social history, past surgical history and problem list. Problem list updated.  Objective:   Vitals:   12/25/16 0858  BP: 110/66  Pulse: 91  Weight: 190 lb (86.2 kg)    Fetal Status: Fetal Heart Rate (bpm): 151 Fundal Height: 28 cm Movement: Present     General:  Alert, oriented and cooperative. Patient is in no acute distress.  Skin: Skin is warm and dry. No rash noted.   Cardiovascular: Normal heart rate noted  Respiratory: Normal respiratory effort, no problems with respiration noted  Abdomen: Soft, gravid, appropriate for gestational age.  Pain/Pressure: Absent     Pelvic: Cervical exam deferred        Extremities: Normal range of motion.  Edema: Trace  Mental Status:  Normal mood and affect. Normal behavior. Normal judgment and thought content.   Assessment and Plan:  Pregnancy: G3P0020 at 1768w2d  1. Supervision of normal first pregnancy, antepartum Patient is doing well without complaints Third trimester labs today - Glucose Tolerance, 2 Hours w/1 Hour - CBC - HIV antibody (with reflex) - RPR  2. Rh negative, antepartum Rhogam today  Preterm labor symptoms and general obstetric precautions including but not limited to vaginal bleeding, contractions, leaking of fluid and fetal movement were reviewed in detail with  the patient. Please refer to After Visit Summary for other counseling recommendations.  Return in about 2 weeks (around 01/08/2017) for ROB.   Catalina AntiguaPeggy Aleli Navedo, MD

## 2016-12-25 NOTE — Progress Notes (Signed)
ROB/GTT. Declined FLU. TDAP  Given in right deltoid.  Tolerated well. RhoGAM given in LUOQ. Tolerated well. Administrations This Visit    rho (d) immune globulin (RHIG/RHOPHYLAC) injection 300 mcg    Admin Date 12/25/2016 Action Given Dose 300 mcg Route Intramuscular Administered By Maretta BeesMcGlashan, Zamariya Neal J, RMA

## 2016-12-26 LAB — CBC
HEMATOCRIT: 30.8 % — AB (ref 34.0–46.6)
HEMOGLOBIN: 10.2 g/dL — AB (ref 11.1–15.9)
MCH: 30.8 pg (ref 26.6–33.0)
MCHC: 33.1 g/dL (ref 31.5–35.7)
MCV: 93 fL (ref 79–97)
Platelets: 267 10*3/uL (ref 150–379)
RBC: 3.31 x10E6/uL — ABNORMAL LOW (ref 3.77–5.28)
RDW: 14 % (ref 12.3–15.4)
WBC: 6.6 10*3/uL (ref 3.4–10.8)

## 2016-12-26 LAB — GLUCOSE TOLERANCE, 2 HOURS W/ 1HR
GLUCOSE, 2 HOUR: 107 mg/dL (ref 65–152)
Glucose, 1 hour: 134 mg/dL (ref 65–179)
Glucose, Fasting: 86 mg/dL (ref 65–91)

## 2016-12-26 LAB — HIV ANTIBODY (ROUTINE TESTING W REFLEX): HIV Screen 4th Generation wRfx: NONREACTIVE

## 2016-12-26 LAB — RPR: RPR: NONREACTIVE

## 2017-01-06 ENCOUNTER — Telehealth: Payer: Self-pay | Admitting: *Deleted

## 2017-01-06 NOTE — Telephone Encounter (Signed)
Pt called to office stating she needs note for her work and she would like a copy of her lab results.   Attempt to contact pt. No answer, LM on VM to call back.

## 2017-01-06 NOTE — Telephone Encounter (Signed)
Spoke with pt. Pt wanted some additional info on RH neg type. Explained what that means and need for Rhogam which pt has received. Pt also is requesting changes in work conditions. 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 Pt advised she will have to discuss work with her provider in order to get approval in changes.  Pt will discuss with provider on Monday at appt. Noted to give pt info on RH type and Rhogam at appt as well.

## 2017-01-12 ENCOUNTER — Ambulatory Visit (INDEPENDENT_AMBULATORY_CARE_PROVIDER_SITE_OTHER): Payer: Medicaid Other | Admitting: Obstetrics and Gynecology

## 2017-01-12 ENCOUNTER — Encounter: Payer: Self-pay | Admitting: Obstetrics and Gynecology

## 2017-01-12 VITALS — BP 101/66 | HR 84 | Wt 188.0 lb

## 2017-01-12 DIAGNOSIS — Z3400 Encounter for supervision of normal first pregnancy, unspecified trimester: Secondary | ICD-10-CM

## 2017-01-12 DIAGNOSIS — O26899 Other specified pregnancy related conditions, unspecified trimester: Principal | ICD-10-CM

## 2017-01-12 DIAGNOSIS — O9989 Other specified diseases and conditions complicating pregnancy, childbirth and the puerperium: Secondary | ICD-10-CM

## 2017-01-12 DIAGNOSIS — M5432 Sciatica, left side: Secondary | ICD-10-CM

## 2017-01-12 DIAGNOSIS — O09893 Supervision of other high risk pregnancies, third trimester: Secondary | ICD-10-CM

## 2017-01-12 DIAGNOSIS — Z6791 Unspecified blood type, Rh negative: Secondary | ICD-10-CM

## 2017-01-12 MED ORDER — COMFORT FIT MATERNITY SUPP MED MISC: 0 refills | Status: DC

## 2017-01-12 NOTE — Progress Notes (Signed)
   PRENATAL VISIT NOTE  Subjective:  Christine Harris is a 31 y.o. G3P0020 at 9269w6d being seen today for ongoing prenatal care.  She is currently monitored for the following issues for this low-risk pregnancy and has Recurrent boils; Body piercing; Supervision of normal first pregnancy, antepartum; Rh negative, antepartum; and Sciatic nerve pain on their problem list.  Patient reports backache.  Contractions: Not present. Vag. Bleeding: None.  Movement: Present. Denies leaking of fluid.   The following portions of the patient's history were reviewed and updated as appropriate: allergies, current medications, past family history, past medical history, past social history, past surgical history and problem list. Problem list updated.  Objective:   Vitals:   01/12/17 1017  BP: 101/66  Pulse: 84  Weight: 188 lb (85.3 kg)    Fetal Status: Fetal Heart Rate (bpm): 140 Fundal Height: 30 cm Movement: Present     General:  Alert, oriented and cooperative. Patient is in no acute distress.  Skin: Skin is warm and dry. No rash noted.   Cardiovascular: Normal heart rate noted  Respiratory: Normal respiratory effort, no problems with respiration noted  Abdomen: Soft, gravid, appropriate for gestational age.  Pain/Pressure: Present     Pelvic: Cervical exam deferred        Extremities: Normal range of motion.  Edema: Trace  Mental Status:  Normal mood and affect. Normal behavior. Normal judgment and thought content.   Assessment and Plan:  Pregnancy: G3P0020 at 6369w6d  1. Supervision of normal first pregnancy, antepartum Patient is doing well without complaints   2. Rh negative, antepartum S/p rhogam  3. Left sciatic nerve pain Patient continues to see Dr. Adrian BlackwaterStinson Rx maternity belt provided as patient lost her last one  Preterm labor symptoms and general obstetric precautions including but not limited to vaginal bleeding, contractions, leaking of fluid and fetal movement were reviewed in  detail with the patient. Please refer to After Visit Summary for other counseling recommendations.  No Follow-up on file.   Catalina AntiguaPeggy Madisin Hasan, MD

## 2017-01-15 ENCOUNTER — Ambulatory Visit (INDEPENDENT_AMBULATORY_CARE_PROVIDER_SITE_OTHER): Payer: Medicaid Other | Admitting: Family Medicine

## 2017-01-15 VITALS — BP 102/61 | HR 82 | Wt 192.0 lb

## 2017-01-15 DIAGNOSIS — M5432 Sciatica, left side: Secondary | ICD-10-CM | POA: Diagnosis not present

## 2017-01-15 DIAGNOSIS — M9904 Segmental and somatic dysfunction of sacral region: Secondary | ICD-10-CM | POA: Diagnosis not present

## 2017-01-15 DIAGNOSIS — M9903 Segmental and somatic dysfunction of lumbar region: Secondary | ICD-10-CM

## 2017-01-15 DIAGNOSIS — Z3400 Encounter for supervision of normal first pregnancy, unspecified trimester: Secondary | ICD-10-CM

## 2017-01-15 DIAGNOSIS — O9989 Other specified diseases and conditions complicating pregnancy, childbirth and the puerperium: Secondary | ICD-10-CM | POA: Diagnosis not present

## 2017-01-15 DIAGNOSIS — Z3483 Encounter for supervision of other normal pregnancy, third trimester: Secondary | ICD-10-CM

## 2017-01-15 NOTE — Progress Notes (Signed)
   PRENATAL VISIT NOTE  Subjective:  Christine Harris is a 31 y.o. G3P0020 at 3649w2d being seen today for ongoing prenatal care.  She is currently monitored for the following issues for this low-risk pregnancy and has Recurrent boils; Body piercing; Supervision of normal first pregnancy, antepartum; Rh negative, antepartum; and Sciatic nerve pain on their problem list.  Patient reports backache. OMT helped at last appointment, but pain starting to return. Placed on leave from postal work. No radiation of pain. Has been doing exercises. Contractions: Irritability. Vag. Bleeding: None.  Movement: Present. Denies leaking of fluid.   The following portions of the patient's history were reviewed and updated as appropriate: allergies, current medications, past family history, past medical history, past social history, past surgical history and problem list. Problem list updated.  Objective:   Vitals:   01/15/17 0839  BP: 102/61  Pulse: 82  Weight: 192 lb (87.1 kg)    Fetal Status:     Movement: Present     General:  Alert, oriented and cooperative. Patient is in no acute distress.  Skin: Skin is warm and dry. No rash noted.   Cardiovascular: Normal heart rate noted  Respiratory: Normal respiratory effort, no problems with respiration noted  Abdomen: Soft, gravid, appropriate for gestational age. Pain/Pressure: Present     Pelvic:  Cervical exam deferred        MSK: Restriction, tenderness, tissue texture changes, and mild paraspinal spasm in the left lumbar spine  Neuro: Moves all four extremities with no focal neurological deficit  Extremities: Normal range of motion.  Edema: Trace  Mental Status: Normal mood and affect. Normal behavior. Normal judgment and thought content.   OSE: Head   Cervical   Thoracic   Rib   Lumbar  L5 ESRL  Sacrum  L/L  Pelvis  right ant    Assessment and Plan:  Pregnancy: G3P0020 at 8749w2d  1. Supervision of normal first pregnancy, antepartum FHT  normal  2. Left sciatic nerve pain 3. Somatic dysfunction of lumbar region 4. Somatic dysfunction of sacral region OMT done after patient permission. HVLA technique utilized. Patient tolerated procedure well.    Preterm labor symptoms and general obstetric precautions including but not limited to vaginal bleeding, contractions, leaking of fluid and fetal movement were reviewed in detail with the patient. Please refer to After Visit Summary for other counseling recommendations.  Return in about 2 weeks (around 01/29/2017).  Levie HeritageStinson, Jacob J, DO

## 2017-01-26 ENCOUNTER — Ambulatory Visit (INDEPENDENT_AMBULATORY_CARE_PROVIDER_SITE_OTHER): Payer: Medicaid Other | Admitting: Obstetrics and Gynecology

## 2017-01-26 ENCOUNTER — Encounter: Payer: Self-pay | Admitting: Obstetrics and Gynecology

## 2017-01-26 VITALS — BP 108/71 | HR 88 | Wt 191.0 lb

## 2017-01-26 DIAGNOSIS — Z3400 Encounter for supervision of normal first pregnancy, unspecified trimester: Secondary | ICD-10-CM

## 2017-01-26 DIAGNOSIS — Z6791 Unspecified blood type, Rh negative: Secondary | ICD-10-CM

## 2017-01-26 DIAGNOSIS — O09899 Supervision of other high risk pregnancies, unspecified trimester: Secondary | ICD-10-CM

## 2017-01-26 DIAGNOSIS — O26899 Other specified pregnancy related conditions, unspecified trimester: Secondary | ICD-10-CM

## 2017-01-26 NOTE — Progress Notes (Signed)
Subjective:  Christine Harris is a 31 y.o. G3P0020 at 6853w6d being seen today for ongoing prenatal care.  She is currently monitored for the following issues for this low-risk pregnancy and has Recurrent boils; Body piercing; Supervision of normal first pregnancy, antepartum; Rh negative, antepartum; and Sciatic nerve pain on their problem list.  Patient reports no complaints.  Contractions: Irregular. Vag. Bleeding: None.  Movement: Present. Denies leaking of fluid.   The following portions of the patient's history were reviewed and updated as appropriate: allergies, current medications, past family history, past medical history, past social history, past surgical history and problem list. Problem list updated.  Objective:   Vitals:   01/26/17 1325  BP: 108/71  Pulse: 88  Weight: 191 lb (86.6 kg)    Fetal Status:     Movement: Present     General:  Alert, oriented and cooperative. Patient is in no acute distress.  Skin: Skin is warm and dry. No rash noted.   Cardiovascular: Normal heart rate noted  Respiratory: Normal respiratory effort, no problems with respiration noted  Abdomen: Soft, gravid, appropriate for gestational age. Pain/Pressure: Present     Pelvic:  Cervical exam deferred        Extremities: Normal range of motion.  Edema: Mild pitting, slight indentation  Mental Status: Normal mood and affect. Normal behavior. Normal judgment and thought content.   Urinalysis:      Assessment and Plan:  Pregnancy: G3P0020 at 4153w6d  1. Supervision of normal first pregnancy, antepartum Stable  2. Rh negative, antepartum S/P Rhogam  Preterm labor symptoms and general obstetric precautions including but not limited to vaginal bleeding, contractions, leaking of fluid and fetal movement were reviewed in detail with the patient. Please refer to After Visit Summary for other counseling recommendations.  Return in about 2 weeks (around 02/09/2017) for OB visit.   Hermina StaggersErvin, Yossi Hinchman L,  MD

## 2017-01-29 ENCOUNTER — Ambulatory Visit (INDEPENDENT_AMBULATORY_CARE_PROVIDER_SITE_OTHER): Payer: Medicaid Other | Admitting: Family Medicine

## 2017-01-29 VITALS — BP 106/63 | HR 87 | Wt 191.0 lb

## 2017-01-29 DIAGNOSIS — M5432 Sciatica, left side: Secondary | ICD-10-CM

## 2017-01-29 DIAGNOSIS — M9903 Segmental and somatic dysfunction of lumbar region: Secondary | ICD-10-CM

## 2017-01-29 DIAGNOSIS — O9989 Other specified diseases and conditions complicating pregnancy, childbirth and the puerperium: Secondary | ICD-10-CM

## 2017-01-29 DIAGNOSIS — Z3400 Encounter for supervision of normal first pregnancy, unspecified trimester: Secondary | ICD-10-CM

## 2017-01-29 DIAGNOSIS — Z3483 Encounter for supervision of other normal pregnancy, third trimester: Secondary | ICD-10-CM

## 2017-01-29 DIAGNOSIS — M9904 Segmental and somatic dysfunction of sacral region: Secondary | ICD-10-CM

## 2017-01-29 NOTE — Progress Notes (Signed)
   PRENATAL VISIT NOTE  Subjective:  Christine Harris is a 31 y.o. G3P0020 at 5031w2d being seen today for ongoing prenatal care.  She is currently monitored for the following issues for this low-risk pregnancy and has Recurrent boils; Body piercing; Supervision of normal first pregnancy, antepartum; Rh negative, antepartum; and Sciatic nerve pain on their problem list.  Patient reports back pain improved after last appointment. Still has some left sided discomfort when stepping out of car. No radiation of pain currently..  Contractions: Not present.  .  Movement: Present. Denies leaking of fluid.   The following portions of the patient's history were reviewed and updated as appropriate: allergies, current medications, past family history, past medical history, past social history, past surgical history and problem list. Problem list updated.  Objective:   Vitals:   01/29/17 1038  BP: 106/63  Pulse: 87  Weight: 191 lb (86.6 kg)    Fetal Status: Fetal Heart Rate (bpm): 150   Movement: Present     General:  Alert, oriented and cooperative. Patient is in no acute distress.  Skin: Skin is warm and dry. No rash noted.   Cardiovascular: Normal heart rate noted  Respiratory: Normal respiratory effort, no problems with respiration noted  Abdomen: Soft, gravid, appropriate for gestational age. Pain/Pressure: Present     Pelvic:  Cervical exam deferred        MSK: Restriction, tenderness, tissue texture changes, and paraspinal spasm in the left lumbar spine  Neuro: Moves all four extremities with no focal neurological deficit  Extremities: Normal range of motion.  Edema: Trace  Mental Status: Normal mood and affect. Normal behavior. Normal judgment and thought content.   OSE: Head   Cervical   Thoracic   Rib   Lumbar  L5 ESRL  Sacrum  L/L  Pelvis R ant innom    Assessment and Plan:  Pregnancy: G3P0020 at 3131w2d  1. Supervision of normal first pregnancy, antepartum FHT normal  2.  Left sciatic nerve pain 3. Somatic dysfunction of lumbar region 4. Somatic dysfunction of sacral region OMT done after patient permission. HVLA technique utilized. Patient tolerated procedure well.    Preterm labor symptoms and general obstetric precautions including but not limited to vaginal bleeding, contractions, leaking of fluid and fetal movement were reviewed in detail with the patient. Please refer to After Visit Summary for other counseling recommendations.  Return in about 2 weeks (around 02/12/2017).  Levie HeritageStinson, Leyanna Bittman J, DO

## 2017-02-05 ENCOUNTER — Ambulatory Visit (INDEPENDENT_AMBULATORY_CARE_PROVIDER_SITE_OTHER): Payer: Medicaid Other | Admitting: Family Medicine

## 2017-02-05 VITALS — BP 107/60 | HR 67 | Wt 194.0 lb

## 2017-02-05 DIAGNOSIS — M9904 Segmental and somatic dysfunction of sacral region: Secondary | ICD-10-CM | POA: Diagnosis not present

## 2017-02-05 DIAGNOSIS — Z3400 Encounter for supervision of normal first pregnancy, unspecified trimester: Secondary | ICD-10-CM

## 2017-02-05 DIAGNOSIS — M5432 Sciatica, left side: Secondary | ICD-10-CM | POA: Diagnosis not present

## 2017-02-05 DIAGNOSIS — M9902 Segmental and somatic dysfunction of thoracic region: Secondary | ICD-10-CM | POA: Diagnosis not present

## 2017-02-05 DIAGNOSIS — O9989 Other specified diseases and conditions complicating pregnancy, childbirth and the puerperium: Secondary | ICD-10-CM

## 2017-02-05 DIAGNOSIS — M9903 Segmental and somatic dysfunction of lumbar region: Secondary | ICD-10-CM | POA: Diagnosis not present

## 2017-02-05 NOTE — Progress Notes (Signed)
   PRENATAL VISIT NOTE  Subjective:  Christine Harris is a 31 y.o. G3P0020 at 2278w2d being seen today for ongoing prenatal care.  She is currently monitored for the following issues for this low-risk pregnancy and has Recurrent boils; Body piercing; Supervision of normal first pregnancy, antepartum; Rh negative, antepartum; and Sciatic nerve pain on their problem list.  Patient reports slightly increased pain - has been cleaning. New pain between shoulder blades. Having difficulty sleeping. .  Contractions: Not present. Vag. Bleeding: None.  Movement: Present. Denies leaking of fluid.   The following portions of the patient's history were reviewed and updated as appropriate: allergies, current medications, past family history, past medical history, past social history, past surgical history and problem list. Problem list updated.  Objective:   Vitals:   02/05/17 1017  BP: 107/60  Pulse: 67  Weight: 194 lb (88 kg)    Fetal Status: Fetal Heart Rate (bpm): 136   Movement: Present     General:  Alert, oriented and cooperative. Patient is in no acute distress.  Skin: Skin is warm and dry. No rash noted.   Cardiovascular: Normal heart rate noted  Respiratory: Normal respiratory effort, no problems with respiration noted  Abdomen: Soft, gravid, appropriate for gestational age. Pain/Pressure: Present     Pelvic:  Cervical exam deferred        MSK: Restriction, tenderness, tissue texture changes, and paraspinal spasm in the lumbar and thoracic spine  Neuro: Moves all four extremities with no focal neurological deficit  Extremities: Normal range of motion.  Edema: Trace  Mental Status: Normal mood and affect. Normal behavior. Normal judgment and thought content.   OSE: Head   Cervical   Thoracic  T4 FSRR, T2 FSRL,   Rib  4R inhaled  Lumbar L1 ESRR, L5 ESRL  Sacrum L/L  Pelvis R ant innom    Assessment and Plan:  Pregnancy: G3P0020 at 6878w2d  1. Supervision of normal first pregnancy,  antepartum FH normal  2. Left sciatic nerve pain 3. Somatic dysfunction of lumbar region 4. Somatic dysfunction of sacral region 5. Somatic dysfunction of thoracic region OMT done after patient permission. HVLA technique utilized. Patient tolerated procedure well.    Preterm labor symptoms and general obstetric precautions including but not limited to vaginal bleeding, contractions, leaking of fluid and fetal movement were reviewed in detail with the patient. Please refer to After Visit Summary for other counseling recommendations.  No Follow-up on file.  Levie HeritageStinson, Jacob J, DO

## 2017-02-05 NOTE — Progress Notes (Signed)
Pt c/o back pain & shortness of breath

## 2017-02-09 ENCOUNTER — Encounter: Payer: Medicaid Other | Admitting: Obstetrics and Gynecology

## 2017-02-12 ENCOUNTER — Encounter: Payer: Self-pay | Admitting: Obstetrics & Gynecology

## 2017-02-12 ENCOUNTER — Ambulatory Visit (INDEPENDENT_AMBULATORY_CARE_PROVIDER_SITE_OTHER): Payer: Medicaid Other | Admitting: Obstetrics & Gynecology

## 2017-02-12 ENCOUNTER — Other Ambulatory Visit (HOSPITAL_COMMUNITY)
Admission: RE | Admit: 2017-02-12 | Discharge: 2017-02-12 | Disposition: A | Discharge status: Home / Self Care | Payer: Medicaid Other | Source: Ambulatory Visit | Attending: Obstetrics & Gynecology | Admitting: Obstetrics & Gynecology

## 2017-02-12 VITALS — BP 103/68 | HR 97 | Wt 195.0 lb

## 2017-02-12 DIAGNOSIS — Z3400 Encounter for supervision of normal first pregnancy, unspecified trimester: Secondary | ICD-10-CM | POA: Diagnosis not present

## 2017-02-12 LAB — OB RESULTS CONSOLE GBS: STREP GROUP B AG: NEGATIVE

## 2017-02-12 NOTE — Progress Notes (Signed)
   PRENATAL VISIT NOTE  Subjective:  Christine Harris is a 31 y.o. G3P0020 at 8712w2d being seen today for ongoing prenatal care.  She is currently monitored for the following issues for this low-risk pregnancy and has Recurrent boils; Body piercing; Supervision of normal first pregnancy, antepartum; Rh negative, antepartum; and Sciatic nerve pain on their problem list.  Patient reports no complaints.  Contractions: Not present. Vag. Bleeding: None.  Movement: Present. Denies leaking of fluid.   The following portions of the patient's history were reviewed and updated as appropriate: allergies, current medications, past family history, past medical history, past social history, past surgical history and problem list. Problem list updated.  Objective:   Vitals:   02/12/17 1558  BP: 103/68  Pulse: 97  Weight: 88.5 kg (195 lb)    Fetal Status:     Movement: Present     General:  Alert, oriented and cooperative. Patient is in no acute distress.  Skin: Skin is warm and dry. No rash noted.   Cardiovascular: Normal heart rate noted  Respiratory: Normal respiratory effort, no problems with respiration noted  Abdomen: Soft, gravid, appropriate for gestational age.  Pain/Pressure: Present     Pelvic: Cervical exam deferred        Extremities: Normal range of motion.  Edema: Mild pitting, slight indentation  Mental Status:  Normal mood and affect. Normal behavior. Normal judgment and thought content.   Assessment and Plan:  Pregnancy: G3P0020 at 2312w2d  1. Supervision of normal first pregnancy, antepartum  - Strep Gp B NAA - Cervicovaginal ancillary only  Preterm labor symptoms and general obstetric precautions including but not limited to vaginal bleeding, contractions, leaking of fluid and fetal movement were reviewed in detail with the patient. Please refer to After Visit Summary for other counseling recommendations.  Return in about 1 week (around 02/19/2017).   Allie BossierMyra C Kourtland Coopman, MD

## 2017-02-13 LAB — CERVICOVAGINAL ANCILLARY ONLY
CHLAMYDIA, DNA PROBE: NEGATIVE
NEISSERIA GONORRHEA: NEGATIVE

## 2017-02-14 LAB — STREP GP B NAA: STREP GROUP B AG: NEGATIVE

## 2017-02-16 ENCOUNTER — Ambulatory Visit (INDEPENDENT_AMBULATORY_CARE_PROVIDER_SITE_OTHER): Payer: Medicaid Other | Admitting: Family Medicine

## 2017-02-16 VITALS — BP 104/60 | HR 73 | Wt 193.0 lb

## 2017-02-16 DIAGNOSIS — M9903 Segmental and somatic dysfunction of lumbar region: Secondary | ICD-10-CM | POA: Diagnosis not present

## 2017-02-16 DIAGNOSIS — M5432 Sciatica, left side: Secondary | ICD-10-CM

## 2017-02-16 DIAGNOSIS — O9989 Other specified diseases and conditions complicating pregnancy, childbirth and the puerperium: Secondary | ICD-10-CM

## 2017-02-16 DIAGNOSIS — M9904 Segmental and somatic dysfunction of sacral region: Secondary | ICD-10-CM | POA: Diagnosis not present

## 2017-02-16 DIAGNOSIS — Z3400 Encounter for supervision of normal first pregnancy, unspecified trimester: Secondary | ICD-10-CM

## 2017-02-16 DIAGNOSIS — M9902 Segmental and somatic dysfunction of thoracic region: Secondary | ICD-10-CM

## 2017-02-16 DIAGNOSIS — Z3483 Encounter for supervision of other normal pregnancy, third trimester: Secondary | ICD-10-CM

## 2017-02-16 NOTE — Progress Notes (Signed)
   PRENATAL VISIT NOTE  Subjective:  Christine Harris is a 31 y.o. G3P0020 at 2122w6d being seen today for ongoing prenatal care.  She is currently monitored for the following issues for this low-risk pregnancy and has Recurrent boils; Body piercing; Supervision of normal first pregnancy, antepartum; Rh negative, antepartum; and Sciatic nerve pain on their problem list.  Patient reports backpain on left. Improving. OMT greatly helps..  Contractions: Irritability. Vag. Bleeding: None.  Movement: Present. Denies leaking of fluid.   The following portions of the patient's history were reviewed and updated as appropriate: allergies, current medications, past family history, past medical history, past social history, past surgical history and problem list. Problem list updated.  Objective:   Vitals:   02/16/17 1003  BP: 104/60  Pulse: 73  Weight: 193 lb (87.5 kg)    Fetal Status: Fetal Heart Rate (bpm): 138   Movement: Present     General:  Alert, oriented and cooperative. Patient is in no acute distress.  Skin: Skin is warm and dry. No rash noted.   Cardiovascular: Normal heart rate noted  Respiratory: Normal respiratory effort, no problems with respiration noted  Abdomen: Soft, gravid, appropriate for gestational age. Pain/Pressure: Present     Pelvic:  Cervical exam deferred        MSK: Restriction, tenderness, tissue texture changes, and paraspinal spasm in the left lumbar spine  Neuro: Moves all four extremities with no focal neurological deficit  Extremities: Normal range of motion.  Edema: Trace  Mental Status: Normal mood and affect. Normal behavior. Normal judgment and thought content.   OSE: Head   Cervical   Thoracic   Rib   Lumbar L5 ESRL, L1 ESRR  Sacrum L/L  Pelvis R ant innom    Assessment and Plan:  Pregnancy: G3P0020 at 3222w6d  1. Supervision of normal first pregnancy, antepartum FHT normal  2. Left sciatic nerve pain 3. Somatic dysfunction of lumbar  region 4. Somatic dysfunction of sacral region 5. Somatic dysfunction of thoracic region OMT done after patient permission. HVLA technique utilized. Patient tolerated procedure well.    Preterm labor symptoms and general obstetric precautions including but not limited to vaginal bleeding, contractions, leaking of fluid and fetal movement were reviewed in detail with the patient. Please refer to After Visit Summary for other counseling recommendations.  No Follow-up on file.  Christine Harris, Jacob J, DO

## 2017-02-18 ENCOUNTER — Ambulatory Visit (INDEPENDENT_AMBULATORY_CARE_PROVIDER_SITE_OTHER): Payer: Medicaid Other | Admitting: Obstetrics and Gynecology

## 2017-02-18 ENCOUNTER — Encounter: Payer: Self-pay | Admitting: Obstetrics and Gynecology

## 2017-02-18 DIAGNOSIS — Z3400 Encounter for supervision of normal first pregnancy, unspecified trimester: Secondary | ICD-10-CM

## 2017-02-18 DIAGNOSIS — Z3403 Encounter for supervision of normal first pregnancy, third trimester: Secondary | ICD-10-CM

## 2017-02-18 NOTE — Progress Notes (Signed)
   PRENATAL VISIT NOTE  Subjective:  Christine Harris is a 31 y.o. G3P0020 at 6033w1d being seen today for ongoing prenatal care.  She is currently monitored for the following issues for this low-risk pregnancy and has Recurrent boils; Body piercing; Supervision of normal first pregnancy, antepartum; Rh negative, antepartum; and Sciatic nerve pain on their problem list.  Patient reports no complaints.  Contractions: Irregular. Vag. Bleeding: None.  Movement: Present. Denies leaking of fluid.   The following portions of the patient's history were reviewed and updated as appropriate: allergies, current medications, past family history, past medical history, past social history, past surgical history and problem list. Problem list updated.  Objective:   Vitals:   02/18/17 1600  BP: 109/75  Pulse: 73  Weight: 193 lb (87.5 kg)    Fetal Status: Fetal Heart Rate (bpm): 150 Fundal Height: 36 cm Movement: Present  Presentation: Vertex  General:  Alert, oriented and cooperative. Patient is in no acute distress.  Skin: Skin is warm and dry. No rash noted.   Cardiovascular: Normal heart rate noted  Respiratory: Normal respiratory effort, no problems with respiration noted  Abdomen: Soft, gravid, appropriate for gestational age.  Pain/Pressure: Absent     Pelvic: Cervical exam performed Dilation: Closed Effacement (%): Thick Station: Ballotable  Extremities: Normal range of motion.  Edema: Trace  Mental Status:  Normal mood and affect. Normal behavior. Normal judgment and thought content.   Assessment and Plan:  Pregnancy: G3P0020 at 3333w1d  1. Supervision of normal first pregnancy, antepartum Patient is doing well without complaints Preterm labor precautions reviewed  Preterm labor symptoms and general obstetric precautions including but not limited to vaginal bleeding, contractions, leaking of fluid and fetal movement were reviewed in detail with the patient. Please refer to After Visit  Summary for other counseling recommendations.  No Follow-up on file.   Catalina AntiguaPeggy Clarke Amburn, MD

## 2017-02-28 ENCOUNTER — Encounter (HOSPITAL_COMMUNITY): Payer: Self-pay

## 2017-02-28 ENCOUNTER — Inpatient Hospital Stay (HOSPITAL_COMMUNITY): Payer: Medicaid Other | Admitting: Anesthesiology

## 2017-02-28 ENCOUNTER — Inpatient Hospital Stay (HOSPITAL_COMMUNITY)
Admission: AD | Admit: 2017-02-28 | Discharge: 2017-03-03 | DRG: 806 | Disposition: A | Discharge status: Home / Self Care | Payer: Medicaid Other | Source: Ambulatory Visit | Attending: Obstetrics and Gynecology | Admitting: Obstetrics and Gynecology

## 2017-02-28 DIAGNOSIS — E669 Obesity, unspecified: Secondary | ICD-10-CM | POA: Diagnosis present

## 2017-02-28 DIAGNOSIS — O4292 Full-term premature rupture of membranes, unspecified as to length of time between rupture and onset of labor: Principal | ICD-10-CM | POA: Diagnosis present

## 2017-02-28 DIAGNOSIS — O99214 Obesity complicating childbirth: Secondary | ICD-10-CM | POA: Diagnosis present

## 2017-02-28 DIAGNOSIS — Z3A37 37 weeks gestation of pregnancy: Secondary | ICD-10-CM

## 2017-02-28 DIAGNOSIS — Z6791 Unspecified blood type, Rh negative: Secondary | ICD-10-CM | POA: Diagnosis not present

## 2017-02-28 DIAGNOSIS — O9902 Anemia complicating childbirth: Secondary | ICD-10-CM | POA: Diagnosis present

## 2017-02-28 DIAGNOSIS — D649 Anemia, unspecified: Secondary | ICD-10-CM | POA: Diagnosis present

## 2017-02-28 DIAGNOSIS — O4202 Full-term premature rupture of membranes, onset of labor within 24 hours of rupture: Secondary | ICD-10-CM | POA: Diagnosis not present

## 2017-02-28 DIAGNOSIS — Z3400 Encounter for supervision of normal first pregnancy, unspecified trimester: Secondary | ICD-10-CM

## 2017-02-28 DIAGNOSIS — O26893 Other specified pregnancy related conditions, third trimester: Secondary | ICD-10-CM | POA: Diagnosis present

## 2017-02-28 DIAGNOSIS — O26899 Other specified pregnancy related conditions, unspecified trimester: Secondary | ICD-10-CM

## 2017-02-28 DIAGNOSIS — O4290 Premature rupture of membranes, unspecified as to length of time between rupture and onset of labor, unspecified weeks of gestation: Secondary | ICD-10-CM | POA: Diagnosis present

## 2017-02-28 HISTORY — DX: Other specified health status: Z78.9

## 2017-02-28 LAB — RPR: RPR Ser Ql: NONREACTIVE

## 2017-02-28 LAB — CBC
HCT: 33.1 % — ABNORMAL LOW (ref 36.0–46.0)
Hemoglobin: 11.3 g/dL — ABNORMAL LOW (ref 12.0–15.0)
MCH: 31.9 pg (ref 26.0–34.0)
MCHC: 34.1 g/dL (ref 30.0–36.0)
MCV: 93.5 fL (ref 78.0–100.0)
PLATELETS: 247 10*3/uL (ref 150–400)
RBC: 3.54 MIL/uL — ABNORMAL LOW (ref 3.87–5.11)
RDW: 14.2 % (ref 11.5–15.5)
WBC: 5.8 10*3/uL (ref 4.0–10.5)

## 2017-02-28 LAB — POCT FERN TEST: POCT Fern Test: POSITIVE

## 2017-02-28 MED ORDER — NALBUPHINE HCL 10 MG/ML IJ SOLN: 10.0000 mg | Freq: Four times a day (QID) | INTRAMUSCULAR | Status: DC | PRN

## 2017-02-28 MED ORDER — PHENYLEPHRINE 40 MCG/ML (10ML) SYRINGE FOR IV PUSH (FOR BLOOD PRESSURE SUPPORT)
80.0000 ug | PREFILLED_SYRINGE | INTRAVENOUS | Status: DC | PRN
  Filled 2017-02-28: qty 5

## 2017-02-28 MED ORDER — SOD CITRATE-CITRIC ACID 500-334 MG/5ML PO SOLN: 30.0000 mL | ORAL | Status: DC | PRN

## 2017-02-28 MED ORDER — GENTAMICIN SULFATE 40 MG/ML IJ SOLN
170.0000 mg | Freq: Once | INTRAVENOUS | Status: AC
  Administered 2017-02-28: 170 mg via INTRAVENOUS
  Filled 2017-02-28: qty 4.25

## 2017-02-28 MED ORDER — ACETAMINOPHEN 500 MG PO TABS
1000.0000 mg | ORAL_TABLET | Freq: Once | ORAL | Status: AC
  Administered 2017-02-28: 1000 mg via ORAL
  Filled 2017-02-28: qty 2

## 2017-02-28 MED ORDER — OXYTOCIN BOLUS FROM INFUSION
500.0000 mL | Freq: Once | INTRAVENOUS | Status: AC
  Administered 2017-03-01: 500 mL via INTRAVENOUS

## 2017-02-28 MED ORDER — LACTATED RINGERS IV SOLN: 500.0000 mL | Freq: Once | INTRAVENOUS | Status: DC

## 2017-02-28 MED ORDER — FENTANYL 2.5 MCG/ML BUPIVACAINE 1/10 % EPIDURAL INFUSION (WH - ANES)
14.0000 mL/h | INTRAMUSCULAR | Status: DC | PRN
  Administered 2017-02-28: 12 mL/h via EPIDURAL
  Administered 2017-02-28: 14 mL/h via EPIDURAL
  Filled 2017-02-28 (×2): qty 100

## 2017-02-28 MED ORDER — EPHEDRINE 5 MG/ML INJ
10.0000 mg | INTRAVENOUS | Status: DC | PRN
  Filled 2017-02-28: qty 2

## 2017-02-28 MED ORDER — DIPHENHYDRAMINE HCL 50 MG/ML IJ SOLN: 12.5000 mg | INTRAMUSCULAR | Status: DC | PRN

## 2017-02-28 MED ORDER — ACETAMINOPHEN 325 MG PO TABS
650.0000 mg | ORAL_TABLET | ORAL | Status: DC | PRN
  Administered 2017-03-01: 650 mg via ORAL
  Filled 2017-02-28 (×2): qty 2

## 2017-02-28 MED ORDER — SODIUM BICARBONATE 8.4 % IV SOLN
INTRAVENOUS | Status: DC | PRN
  Administered 2017-02-28: 5 mL via EPIDURAL

## 2017-02-28 MED ORDER — LACTATED RINGERS IV SOLN
500.0000 mL | INTRAVENOUS | Status: DC | PRN
  Administered 2017-02-28: 500 mL via INTRAVENOUS
  Administered 2017-02-28: 300 mL via INTRAVENOUS
  Administered 2017-03-01: 500 mL via INTRAVENOUS

## 2017-02-28 MED ORDER — PHENYLEPHRINE 40 MCG/ML (10ML) SYRINGE FOR IV PUSH (FOR BLOOD PRESSURE SUPPORT)
80.0000 ug | PREFILLED_SYRINGE | INTRAVENOUS | Status: DC | PRN
  Filled 2017-02-28: qty 10
  Filled 2017-02-28: qty 5

## 2017-02-28 MED ORDER — ONDANSETRON HCL 4 MG/2ML IJ SOLN: 4.0000 mg | Freq: Four times a day (QID) | INTRAMUSCULAR | Status: DC | PRN

## 2017-02-28 MED ORDER — LIDOCAINE HCL (PF) 1 % IJ SOLN
INTRAMUSCULAR | Status: DC | PRN
  Administered 2017-02-28: 5 mL via EPIDURAL
  Administered 2017-02-28: 2 mL via EPIDURAL
  Administered 2017-02-28: 3 mL via EPIDURAL

## 2017-02-28 MED ORDER — OXYTOCIN 40 UNITS IN LACTATED RINGERS INFUSION - SIMPLE MED: 2.5000 [IU]/h | INTRAVENOUS | Status: DC

## 2017-02-28 MED ORDER — TERBUTALINE SULFATE 1 MG/ML IJ SOLN
0.2500 mg | Freq: Once | INTRAMUSCULAR | Status: DC | PRN
  Filled 2017-02-28: qty 1

## 2017-02-28 MED ORDER — NALBUPHINE HCL 10 MG/ML IJ SOLN
10.0000 mg | Freq: Four times a day (QID) | INTRAMUSCULAR | Status: DC | PRN
  Filled 2017-02-28: qty 1

## 2017-02-28 MED ORDER — LIDOCAINE HCL (PF) 1 % IJ SOLN
30.0000 mL | INTRAMUSCULAR | Status: DC | PRN
  Filled 2017-02-28: qty 30

## 2017-02-28 MED ORDER — LACTATED RINGERS IV SOLN
INTRAVENOUS | Status: DC
  Administered 2017-02-28 (×4): via INTRAVENOUS

## 2017-02-28 MED ORDER — GENTAMICIN SULFATE 40 MG/ML IJ SOLN
160.0000 mg | Freq: Two times a day (BID) | INTRAMUSCULAR | Status: DC
  Filled 2017-02-28: qty 4

## 2017-02-28 MED ORDER — SODIUM CHLORIDE 0.9 % IV SOLN
2.0000 g | Freq: Four times a day (QID) | INTRAVENOUS | Status: DC
  Administered 2017-02-28 – 2017-03-01 (×2): 2 g via INTRAVENOUS
  Filled 2017-02-28 (×5): qty 2000

## 2017-02-28 MED ORDER — FENTANYL CITRATE (PF) 100 MCG/2ML IJ SOLN
100.0000 ug | INTRAMUSCULAR | Status: DC | PRN
  Administered 2017-02-28 (×2): 100 ug via INTRAVENOUS
  Filled 2017-02-28 (×2): qty 2

## 2017-02-28 MED ORDER — OXYTOCIN 40 UNITS IN LACTATED RINGERS INFUSION - SIMPLE MED
1.0000 m[IU]/min | INTRAVENOUS | Status: DC
  Administered 2017-02-28: 2 m[IU]/min via INTRAVENOUS
  Administered 2017-02-28: 6 m[IU]/min via INTRAVENOUS
  Filled 2017-02-28: qty 1000

## 2017-02-28 NOTE — Progress Notes (Signed)
Pharmacy Antibiotic Note  Christine Harris is a 31 y.o. female admitted on 02/28/2017 with Triple I  Pharmacy has been consulted for Gentamicin dosing.  Plan: 1) Gentamicin 170mg  IV load now. 2) Gentamicin 160mg  IV Q12 to start 03/01/17 @ 0700. 3) Check Scr in am.  Will follow & check levels as appropriate.  Height: 5\' 2"  (157.5 cm) Weight: 195 lb (88.5 kg) IBW/kg (Calculated) : 50.1  Temp (24hrs), Avg:99 F (37.2 C), Min:97.5 F (36.4 C), Max:101 F (38.3 C)  Recent Labs  Lab 02/28/17 0359  WBC 5.8    CrCl cannot be calculated (Patient's most recent lab result is older than the maximum 21 days allowed.).    Allergies  Allergen Reactions  . Codeine Itching    Antimicrobials this admission: Ampicillin 2g IV Q6 hr 02/28/17 >>   Thank you for allowing pharmacy to be a part of this patient's care.  Hovey-Rankin, Ilah Boule 02/28/2017 7:49 PM

## 2017-02-28 NOTE — MAU Note (Signed)
Pt here with c/o contractions and rupture of membranes. Reports good fetal movement. Denies any bleeding. Water broke at [id number]

## 2017-02-28 NOTE — Progress Notes (Addendum)
Labor Progress Note Sharyon CableLanika N Eisen is a 31 y.o. G3P0020 at 116w4d presented for PROM at term  S:  Uncomfortable with ctx, breathing through. IV pain meds earlier, had some relief. Would like epidural O:  BP (!) 99/51   Pulse 96   Temp 98.3 F (36.8 C) (Oral)   Resp 18   Ht 5\' 2"  (1.575 m)   Wt 195 lb (88.5 kg)   LMP 06/10/2016 (Exact Date)   SpO2 99%   BMI 35.67 kg/m  EFM: baseline 140 bpm/ mod variability/ + accels/ rare variable decels  Toco: 4-5 SVE: 3/100/-1   A/P: 31 y.o. G3P0020 886w4d  1. Labor: early active 2. FWB: Cat II 3. Pain: epidural ordered  Anticipate labor progression and SVD.  Donette LarryMelanie Symeon Puleo, CNM 12:02 PM

## 2017-02-28 NOTE — Progress Notes (Signed)
Labor Progress Note Christine CableLanika N Harris is a 31 y.o. G3P0020 at 8653w4d presented for PROM at term.  S:  Uncomfortable with ctx. Requesting pain meds.  O:  BP (!) 99/51   Pulse 96   Temp 98.3 F (36.8 C) (Oral)   Resp 18   Ht 5\' 2"  (1.575 m)   Wt 195 lb (88.5 kg)   LMP 06/10/2016 (Exact Date)   SpO2 99%   BMI 35.67 kg/m  EFM: baseline 140 bpm/ mod variability/ + accels/ no decels  Toco: 3-5 SVE: Dilation: Fingertip Effacement (%): 50 Station: -3 Presentation: Vertex Exam by:: Sandy SalaamLynnsey Johnson, RN   A/P: 31 y.o. G3P0020 7153w4d  1. Labor: latent 2. FWB: Cat I 3. Pain: analgesia prn  Allow 2-3 more hours for spontaneous labor. Consider augmentation if needed. Anticipate SVD.  Donette LarryMelanie Abdulhamid Olgin, CNM 11:02 AM

## 2017-02-28 NOTE — Anesthesia Pain Management Evaluation Note (Signed)
  CRNA Pain Management Visit Note  Patient: Christine Harris, 31 y.o., female  "Hello I am a [facility] of the anesthesia team at [facility]. We have an anesthesia team available at all times to provide care throughout the hospital, including epidural management and anesthesia for C-section. I don't know your plan for the delivery whether it a natural birth, water birth, IV sedation, nitrous supplementation, doula or epidural, but we want to meet your pain goals."   1.Was your pain managed to your expectations on prior hospitalizations?   Yes   2.What is your expectation for pain management during this hospitalization?     Epidural  3.How can we help you reach that goal? epidural  Record the patient's initial score and the patient's pain goal.   Pain: 10  Pain Goal: 10 [facility] wants you to be able to say your pain was always managed very well.  Salome ArntSterling, Eman Morimoto Marie 02/28/2017

## 2017-02-28 NOTE — MAU Note (Signed)
Vertex presentation confirmed by US by Thressa ShellerHeather Hogan CNM.

## 2017-02-28 NOTE — Anesthesia Preprocedure Evaluation (Signed)
Anesthesia Evaluation  Patient identified by MRN, date of birth, ID band Patient awake    Reviewed: Allergy & Precautions, NPO status , Patient's Chart, lab work & pertinent test results  Airway Mallampati: II  TM Distance: >3 FB Neck ROM: Full    Dental  (+) Teeth Intact, Dental Advisory Given   Pulmonary neg pulmonary ROS,    Pulmonary exam normal breath sounds clear to auscultation       Cardiovascular negative cardio ROS Normal cardiovascular exam Rhythm:Regular Rate:Normal     Neuro/Psych negative neurological ROS     GI/Hepatic negative GI ROS, Neg liver ROS,   Endo/Other  Obesity   Renal/GU negative Renal ROS     Musculoskeletal negative musculoskeletal ROS (+)   Abdominal   Peds  Hematology  (+) Blood dyscrasia, anemia , Plt 247k   Anesthesia Other Findings Day of surgery medications reviewed with the patient.  Reproductive/Obstetrics (+) Pregnancy                             Anesthesia Physical Anesthesia Plan  ASA: II  Anesthesia Plan: Epidural   Post-op Pain Management:    Induction:   PONV Risk Score and Plan: 2 and Treatment may vary due to age or medical condition  Airway Management Planned:   Additional Equipment:   Intra-op Plan:   Post-operative Plan:   Informed Consent: I have reviewed the patients History and Physical, chart, labs and discussed the procedure including the risks, benefits and alternatives for the proposed anesthesia with the patient or authorized representative who has indicated his/her understanding and acceptance.   Dental advisory given  Plan Discussed with:   Anesthesia Plan Comments: (Patient identified. Risks/Benefits/Options discussed with patient including but not limited to bleeding, infection, nerve damage, paralysis, failed block, incomplete pain control, headache, blood pressure changes, nausea, vomiting, reactions to  medication both or allergic, itching and postpartum back pain. Confirmed with bedside nurse the patient's most recent platelet count. Confirmed with patient that they are not currently taking any anticoagulation, have any bleeding history or any family history of bleeding disorders. Patient expressed understanding and wished to proceed. All questions were answered. )        Anesthesia Quick Evaluation

## 2017-02-28 NOTE — Anesthesia Procedure Notes (Signed)
Epidural Patient location during procedure: OB Start time: 02/28/2017 1:08 PM End time: 02/28/2017 1:13 PM  Staffing Anesthesiologist: Cecile Hearingurk, Stephen Edward, MD Performed: anesthesiologist   Preanesthetic Checklist Completed: patient identified, pre-op evaluation, timeout performed, IV checked, risks and benefits discussed and monitors and equipment checked  Epidural Patient position: sitting Prep: DuraPrep Patient monitoring: blood pressure and continuous pulse ox Approach: midline Location: L3-L4 Injection technique: LOR air  Needle:  Needle type: Tuohy  Needle gauge: 17 G Needle length: 9 cm Needle insertion depth: 5 cm Catheter size: 19 Gauge Catheter at skin depth: 10 cm Test dose: negative and Other (1% Lidocaine)  Additional Notes Patient identified.  Risk benefits discussed including failed block, incomplete pain control, headache, nerve damage, paralysis, blood pressure changes, nausea, vomiting, reactions to medication both toxic or allergic, and postpartum back pain.  Patient expressed understanding and wished to proceed.  All questions were answered.  Sterile technique used throughout procedure and epidural site dressed with sterile barrier dressing. No paresthesia or other complications noted. The patient did not experience any signs of intravascular injection such as tinnitus or metallic taste in mouth nor signs of intrathecal spread such as rapid motor block. Please see nursing notes for vital signs. Reason for block:procedure for pain

## 2017-02-28 NOTE — Progress Notes (Signed)
Labor Progress Note Christine Harris is a 31 y.o. G3P0020 at 4660w4d presented for PROM at term. Triple I  S:  Comfortable with epidural, recent redose.  O:  BP 106/64   Pulse 92   Temp (!) 101 F (38.3 C) (Oral)   Resp 16   Ht 5\' 2"  (1.575 m)   Wt 195 lb (88.5 kg)   LMP 06/10/2016 (Exact Date)   SpO2 96%   BMI 35.67 kg/m  EFM: baseline 160 bpm/ mod variability/ no accels/ early, variable decels  Toco: 2-4 SVE: Dilation: 5 Effacement (%): 90 Station: -2 Presentation: Vertex Exam by:: Christine Harris CNM   A/P: 31 y.o. G3P0020 2260w4d  1. Labor: protracted active phase 2. FWB: Cat II 3. Pain: epidural 4. Triple I- Amp/Gent  Will start Pitocin augmentation. Anticipate SVD.  Christine Harris, CNM 7:28 PM

## 2017-02-28 NOTE — H&P (Signed)
Christine Harris is a 31 y.o. female 633P0020 @ 37.4wks by LMP presenting for leaking fluid since 0230. Reports some ctx since then; denies bldg. Her preg has been followed by the [facility] service and has been remarkable for 1) Rh neg 2) GBS neg  OB History    Gravida Para Term Preterm AB Living   3 0 0 0 2 0   SAB TAB Ectopic Multiple Live Births   0 2 0 0 0      Obstetric Comments   Tab Feb 04 2013 failed condoms  About ? 6 weeks     Past Medical History:  Diagnosis Date  . Abscess    surgery consult 2003  . Bacterial vaginosis 12/05/2010  . Delayed menses 03/23/2013   6 week tab dec 5th .    [name]. H/O varicella   . H/O: varicose veins   . Medical history non-contributory   . Yeast infection    Past Surgical History:  Procedure Laterality Date  . HERNIA REPAIR  age 35  . TONSILLECTOMY  2010   Family History: family history includes Asthma in her paternal grandmother; Cancer in her maternal grandmother; Colon cancer in her maternal grandmother; Diabetes in her maternal grandfather and maternal grandmother; Heart disease in her maternal grandmother and paternal grandmother; Hyperlipidemia in her maternal grandmother and paternal grandmother; Hypertension in her father and mother; Stroke in her paternal grandmother. Social History:  reports that  has never smoked. she has never used smokeless tobacco. She reports that she drinks alcohol. She reports that she does not use drugs.     Maternal Diabetes: No Genetic Screening: Normal Maternal Ultrasounds/Referrals: Normal Fetal Ultrasounds or other Referrals:  None Maternal Substance Abuse:  No Significant Maternal Medications:  None Significant Maternal Lab Results:  Lab values include: Group B Strep negative Other Comments:  None  ROS History Dilation: Fingertip Effacement (%): 50 Station: -3 Exam by:: B Stanley RN Blood pressure 120/72, pulse 83, temperature 97.9 F (36.6 C), temperature source Oral, resp. rate 18, height 5\' 2"   (1.575 m), weight 88.5 kg (195 lb), last menstrual period 06/10/2016, SpO2 99 %. Exam Physical Exam  Constitutional: She is oriented to person, place, and time. She appears well-developed.  HENT:  Head: Normocephalic.  Neck: Normal range of motion.  Cardiovascular: Normal rate.  GI:  EFM 140s, +accels, no decels Ctx irreg 6-8 mins  Genitourinary:  Genitourinary Comments: Fern + in MAU  Musculoskeletal: Normal range of motion.  Neurological: She is alert and oriented to person, place, and time.  Skin: Skin is warm and dry.  Psychiatric: She has a normal mood and affect. Her behavior is normal. Thought content normal.    Prenatal labs: ABO, Rh: A/Negative/-- (07/02 1622) Antibody: Negative (07/02 1622) Rubella: 9.09 (07/02 1622) RPR: Non Reactive (10/25 1035)  HBsAg: Negative (07/02 1622)  HIV: Non Reactive (10/25 1035)  GBS: Negative (12/13 1634)   Assessment/Plan: IUP@37 .4wks PROM GBS neg  [facility] for [facility]; may be starting labor spont Can augment prn Anticipate SVD   SHAW, KIMBERLY CNM 02/28/2017, 3:17 AM

## 2017-02-28 NOTE — Progress Notes (Signed)
S [id number]  With another patient pushing.  Charge nurse aware

## 2017-02-28 NOTE — Progress Notes (Signed)
Report called to provider, Pincus BadderK Shaw, CNM. She will place orders for admission.

## 2017-03-01 ENCOUNTER — Other Ambulatory Visit: Payer: Self-pay

## 2017-03-01 ENCOUNTER — Encounter (HOSPITAL_COMMUNITY): Payer: Self-pay

## 2017-03-01 DIAGNOSIS — Z3A37 37 weeks gestation of pregnancy: Secondary | ICD-10-CM

## 2017-03-01 DIAGNOSIS — O4202 Full-term premature rupture of membranes, onset of labor within 24 hours of rupture: Secondary | ICD-10-CM

## 2017-03-01 LAB — CREATININE, SERUM
CREATININE: 0.87 mg/dL (ref 0.44–1.00)
GFR calc non Af Amer: >60 mL/min (ref >60)

## 2017-03-01 MED ORDER — SENNOSIDES-DOCUSATE SODIUM 8.6-50 MG PO TABS
2.0000 | ORAL_TABLET | ORAL | Status: DC
  Administered 2017-03-02 (×2): 2 via ORAL
  Filled 2017-03-01 (×2): qty 2

## 2017-03-01 MED ORDER — ACETAMINOPHEN 325 MG PO TABS
650.0000 mg | ORAL_TABLET | ORAL | Status: DC | PRN
  Administered 2017-03-02: 650 mg via ORAL
  Filled 2017-03-01: qty 2

## 2017-03-01 MED ORDER — TETANUS-DIPHTH-ACELL PERTUSSIS 5-2.5-18.5 LF-MCG/0.5 IM SUSP: 0.5000 mL | Freq: Once | INTRAMUSCULAR | Status: DC

## 2017-03-01 MED ORDER — SIMETHICONE 80 MG PO CHEW: 80.0000 mg | CHEWABLE_TABLET | ORAL | Status: DC | PRN

## 2017-03-01 MED ORDER — BENZOCAINE-MENTHOL 20-0.5 % EX AERO: 1.0000 "application " | INHALATION_SPRAY | CUTANEOUS | Status: DC | PRN

## 2017-03-01 MED ORDER — ONDANSETRON HCL 4 MG/2ML IJ SOLN: 4.0000 mg | INTRAMUSCULAR | Status: DC | PRN

## 2017-03-01 MED ORDER — DIPHENHYDRAMINE HCL 25 MG PO CAPS: 25.0000 mg | ORAL_CAPSULE | Freq: Four times a day (QID) | ORAL | Status: DC | PRN

## 2017-03-01 MED ORDER — COCONUT OIL OIL: 1.0000 "application " | TOPICAL_OIL | Status: DC | PRN

## 2017-03-01 MED ORDER — ONDANSETRON HCL 4 MG PO TABS: 4.0000 mg | ORAL_TABLET | ORAL | Status: DC | PRN

## 2017-03-01 MED ORDER — PRENATAL MULTIVITAMIN CH
1.0000 | ORAL_TABLET | Freq: Every day | ORAL | Status: DC
  Administered 2017-03-01 – 2017-03-03 (×3): 1 via ORAL
  Filled 2017-03-01 (×3): qty 1

## 2017-03-01 MED ORDER — WITCH HAZEL-GLYCERIN EX PADS: 1.0000 "application " | MEDICATED_PAD | CUTANEOUS | Status: DC | PRN

## 2017-03-01 MED ORDER — IBUPROFEN 600 MG PO TABS
600.0000 mg | ORAL_TABLET | Freq: Four times a day (QID) | ORAL | Status: DC
  Administered 2017-03-01 – 2017-03-03 (×10): 600 mg via ORAL
  Filled 2017-03-01 (×10): qty 1

## 2017-03-01 MED ORDER — DIBUCAINE 1 % RE OINT: 1.0000 "application " | TOPICAL_OINTMENT | RECTAL | Status: DC | PRN

## 2017-03-01 MED ORDER — ZOLPIDEM TARTRATE 5 MG PO TABS: 5.0000 mg | ORAL_TABLET | Freq: Every evening | ORAL | Status: DC | PRN

## 2017-03-01 NOTE — Lactation Note (Signed)
This note was copied from a baby's chart. Lactation Consultation Note  Patient Name: Christine Mertha BaarsLanika Herandez [id number]'[id number]'s Date: 03/01/2017 Reason for consult: Initial assessment   P1, Baby 12 hours old.  FOB holding baby STS.  Visitors in room. Baby breastfed more than 3 hours ago. Discussed feeding cues and offered to help mother latch baby . Mother states she wants to try later and refused assistance at this time. Mom encouraged to feed baby 8-12 times/24 hours and with feeding cues.  Mom made aware of O/P services, breastfeeding support groups, community resources, and our phone # for post-discharge questions.  Suggest mother call LC if she would like assistance later today.   Maternal Data Has patient been taught Hand Expression?: Yes Does the patient have breastfeeding experience prior to this delivery?: No  Feeding Feeding Type: Breast Fed Length of feed: 15 min  LATCH Score Latch: Repeated attempts needed to sustain latch, nipple held in mouth throughout feeding, stimulation needed to elicit sucking reflex.  Audible Swallowing: A few with stimulation  Type of Nipple: Everted at rest and after stimulation  Comfort (Breast/Nipple): Soft / non-tender  Hold (Positioning): Assistance needed to correctly position infant at breast and maintain latch.  LATCH Score: 7  Interventions    Lactation Tools Discussed/Used     Consult Status Consult Status: Follow-up Date: 03/02/17 Follow-up type: In-patient    Dahlia ByesBerkelhammer, Ruth [facility] 03/01/2017, 3:23 PM

## 2017-03-01 NOTE — Anesthesia Postprocedure Evaluation (Signed)
Anesthesia Post Note  Patient: Christine Harris  Procedure(s) Performed: AN AD HOC LABOR EPIDURAL     Patient location during evaluation: Mother Baby Anesthesia Type: Epidural Level of consciousness: awake Pain management: satisfactory to patient Vital Signs Assessment: post-procedure vital signs reviewed and stable Respiratory status: spontaneous breathing Cardiovascular status: stable Anesthetic complications: no    Last Vitals:  Vitals:   03/01/17 0701 03/01/17 1100  BP: (!) 94/50 (!) 88/50  Pulse: 62 66  Resp: 18 16  Temp: 37.3 C (!) 36.3 C  SpO2: 100% 100%    Last Pain:  Vitals:   03/01/17 1100  TempSrc: Axillary  PainSc: 0-No pain   Pain Goal: Patients Stated Pain Goal: 0 (02/28/17 1033)               Cephus ShellingBURGER,Mareo Portilla

## 2017-03-01 NOTE — Progress Notes (Signed)
LABOR PROGRESS NOTE  Christine CableLanika N Harris is a 31 y.o. G3P0020 at 6031w5d  admitted for IOL for PROM at 0230 yesterday. Developed Tripe I, on IV antibiotics.  Subjective: Doing well. Feeling pressure  Objective: BP 127/69   Pulse 72   Temp (!) 100.5 F (38.1 C) (Oral)   Resp 18   Ht 5\' 2"  (1.575 m)   Wt 195 lb (88.5 kg)   LMP 06/10/2016 (Exact Date)   SpO2 96%   BMI 35.67 kg/m  or  Vitals:   03/01/17 0001 03/01/17 0031 03/01/17 0101 03/01/17 0131  BP: 135/67 140/71 130/76 127/69  Pulse: 80 69 72 72  Resp: 20 16 18 18   Temp:  (!) 100.5 F (38.1 C)    TempSrc:  Oral    SpO2:      Weight:      Height:        SVE: Dilation: Lip/rim Effacement (%): 100 Station: 0 Presentation: Vertex Exam by:: Christine CharonKristin Hutchison, RN FHT: baseline rate 155, moderate varibility, +acel, early decel Toco: MVUs > 200  Assessment / Plan: 31 y.o. G3P0020 at 5831w5d here for PROM  Labor: On IV Pitocin. Progressing well, first stage almost complete Fetal Wellbeing:  Cat I Pain Control:  epidural Anticipated MOD:  SVD Tripe I: on Amp/Gent. FHT reassuring  Christine PearJulie P Degele, MD 03/01/2017, 1:53 AM

## 2017-03-02 ENCOUNTER — Encounter: Payer: Medicaid Other | Admitting: Family Medicine

## 2017-03-02 MED ORDER — RHO D IMMUNE GLOBULIN 1500 UNIT/2ML IJ SOSY
300.0000 ug | PREFILLED_SYRINGE | Freq: Once | INTRAMUSCULAR | Status: AC
  Administered 2017-03-02: 300 ug via INTRAMUSCULAR
  Filled 2017-03-02: qty 2

## 2017-03-02 MED ORDER — IBUPROFEN 600 MG PO TABS: 600.0000 mg | ORAL_TABLET | Freq: Four times a day (QID) | ORAL | 0 refills | Status: AC

## 2017-03-02 NOTE — Discharge Instructions (Signed)

## 2017-03-02 NOTE — Discharge Summary (Signed)
OB Discharge Summary     Patient Name: Christine Harris DOB: 12/10/1985 MRN: [id number] Date of admission: 02/28/2017  Delivering MD: Dorathy KinsmanSMITH, [place] )  Date of discharge: 03/03/2017    Admitting diagnosis: PROM Intrauterine pregnancy: 5492w5d    Secondary diagnosis:  Active Problems:   Patient Active Problem List   Diagnosis Date Noted  . Amniotic fluid leaking 02/28/2017  . Sciatic nerve pain 11/27/2016  . Rh negative, antepartum 09/04/2016  . Supervision of normal first pregnancy, antepartum 09/01/2016  . Recurrent boils 10/29/2011  . Body piercing 10/29/2011    Additional problems: none     Discharge diagnosis: Term Pregnancy Delivered                                                                                                Post partum procedures:none  Complications: None  Hospital course:  Induction of Labor With Vaginal Delivery   31 y.o. yo [id number] at 1692w5d was admitted to the hospital 02/28/2017 for induction of labor.  Indication for induction: PROM.  Patient had an uncomplicated labor course as follows: Membrane Rupture Time/Date: 2:30 AM ,02/28/2017   Intrapartum Procedures: Episiotomy: None [1]                                         Lacerations:  Labial [10]  Patient had delivery of a Viable infant.  Information for the patient's newborn:  Christine Harris, Christine Harris [[id number]][[id number]]  Delivery Method: Vaginal, Spontaneous(Filed from Delivery Summary)   03/01/2017  Details of delivery can be found in separate delivery note.  Patient had a routine postpartum course. Patient is discharged home 03/03/17.  Physical exam  Vitals:   03/02/17 1856 03/03/17 0500  BP: 106/63 (!) 104/53  Pulse: 77 (!) 59  Resp: 18 18  Temp: 98.3 F (36.8 C) 98.1 F (36.7 C)  SpO2:  96%    General: alert, cooperative and no distress Lochia: appropriate Uterine Fundus: firm Incision: N/A DVT Evaluation: No evidence of DVT seen on physical exam.  Labs: Results for orders placed  or performed during the hospital encounter of 02/28/17 (from the past 24 hour(s))  Rh IG workup (includes ABO/Rh)     Status: None (Preliminary result)   Collection Time: 03/02/17 10:53 AM  Result Value Ref Range   Gestational Age(Wks) 37.5    ABO/RH(D) A NEG    Fetal Screen NEG    Unit Number [id number]    Blood Component Type RHIG    Unit division 00    Status of Unit ISSUED    Transfusion Status OK TO TRANSFUSE      Discharge instruction: per After Visit Summary and "Baby and Me Booklet".  After visit meds:  Allergies  Allergen Reactions  . Codeine Itching    Allergies as of 03/03/2017      Reactions   Codeine Itching      Medication List    STOP taking these medications   COMFORT FIT MATERNITY SUPP MED Misc     TAKE these  medications   ibuprofen 600 MG tablet Commonly known as:  ADVIL,MOTRIN Take 1 tablet (600 mg total) by mouth every 6 (six) hours.   multivitamin-prenatal 27-0.8 MG Tabs tablet Take 1 tablet by mouth daily at 12 noon.        Diet: routine diet  Activity: Advance as tolerated. Pelvic rest for 6 weeks.   Outpatient follow up:4 weeks Future Appointments:  Future Appointments  Date Time Provider Department Center  03/30/2017  8:45 AM Hermina StaggersErvin, Michael L, MD [facility] None    Follow up Appt: No Follow-up on file.     Postpartum contraception: Undecided  Newborn Data: APGAR (1 MIN): 8   APGAR (5 MINS): 9     Baby Feeding: Breast Disposition:home with mother  Rolm Bookbindermber Arpi Diebold, DO  03/03/2017

## 2017-03-02 NOTE — Progress Notes (Signed)
Post Partum Day 1 Subjective: up ad lib, voiding and tolerating PO   Objective: Blood pressure 97/62, pulse (!) 59, temperature 98.3 F (36.8 C), temperature source Oral, resp. rate 18, height 5\' 2"  (1.575 m), weight 193 lb (87.5 kg), last menstrual period 06/10/2016, SpO2 100 %, unknown if currently breastfeeding.  Physical Exam:  General: alert and no distress Lochia: appropriate Uterine Fundus: firm DVT Evaluation: No evidence of DVT seen on physical exam.  Recent Labs    02/28/17 0359  HGB 11.3*  HCT 33.1*    Assessment/Plan: Plan for discharge tomorrow, Breastfeeding and Contraception POPs for now   LOS: 2 days   Willodean RosenthalCarolyn Harraway-Smith 03/02/2017, 8:00 AM

## 2017-03-03 LAB — RH IG WORKUP (INCLUDES ABO/RH)
ABO/RH(D): A NEG
Fetal Screen: NEGATIVE
Gestational Age(Wks): 37.5
Unit division: 0

## 2017-03-03 NOTE — Lactation Note (Signed)
This note was copied from a baby's chart. Lactation Consultation Note  Patient Name: Girl Mertha BaarsLanika Gotwalt ZOXWR'UToday's Date: 03/03/2017 Reason for consult: Follow-up assessment Baby at 53 hr of life and dyad set for d/c today. Mom is worried because baby has been "fussy all night" and she is tired. Discussed baby behavior, feeding frequency, pumping, spoon/cup feeding, baby belly size, voids, wt loss, breast changes, and nipple care. Mom is not sure that she wants to continue bf when she gets home, reviewed drying up milk. Mom is aware of lactation services and support group. She will call as needed.    Maternal Data    Feeding Feeding Type: Breast Fed Length of feed: 45 min  LATCH Score                   Interventions Interventions: Breast feeding basics reviewed;Hand express;Support pillows;Position options;Coconut oil;Hand pump  Lactation Tools Discussed/Used Pump Review: Setup, frequency, and cleaning;Milk Storage Initiated by:: ES Date initiated:: 03/03/17   Consult Status Consult Status: Complete Follow-up type: Call as needed    Rulon Eisenmengerlizabeth E Shaqueena Mauceri 03/03/2017, 8:02 AM

## 2017-03-04 LAB — TYPE AND SCREEN
ABO/RH(D): A NEG
ANTIBODY SCREEN: POSITIVE
Unit division: 0
Unit division: 0

## 2017-03-04 LAB — BPAM RBC
Blood Product Expiration Date: 201901252359
Blood Product Expiration Date: 201901252359
UNIT TYPE AND RH: 600
UNIT TYPE AND RH: 600

## 2017-03-09 ENCOUNTER — Encounter: Payer: Medicaid Other | Admitting: Obstetrics and Gynecology

## 2017-03-30 ENCOUNTER — Encounter: Payer: Self-pay | Admitting: Obstetrics and Gynecology

## 2017-03-30 ENCOUNTER — Ambulatory Visit (INDEPENDENT_AMBULATORY_CARE_PROVIDER_SITE_OTHER): Payer: Medicaid Other | Admitting: Obstetrics and Gynecology

## 2017-03-30 DIAGNOSIS — Z30011 Encounter for initial prescription of contraceptive pills: Secondary | ICD-10-CM

## 2017-03-30 MED ORDER — NORETHINDRONE 0.35 MG PO TABS: 1.0000 | ORAL_TABLET | Freq: Every day | ORAL | 11 refills | Status: DC

## 2017-03-30 NOTE — Patient Instructions (Signed)

## 2017-03-30 NOTE — Progress Notes (Signed)
Post Partum Exam  Christine Harris is a 32 y.o. 193P1021 female who presents for a postpartum visit. She is 4 weeks postpartum following a spontaneous vaginal delivery. I have fully reviewed the prenatal and intrapartum course. The delivery was at 3238w5d gestational weeks.  Anesthesia: epidural. Postpartum course has been unremarkable. Baby's course has been unremarkable. Baby is feeding by both breast and bottle - Enfamil.. Bleeding no bleeding. Bowel function is normal. Bladder function is normal. Patient is not sexually active. Contraception method is none. Postpartum depression screening:neg  The following portions of the patient's history were reviewed and updated as appropriate: allergies, current medications, past family history, past medical history, past social history and past surgical history. Last pap smear done 08/2016 and was Normal  Review of Systems Pertinent items are noted in HPI.    Objective:  Blood pressure 118/76, pulse 71, weight 177 lb 6.4 oz (80.5 kg), last menstrual period 06/10/2016, unknown if currently breastfeeding.  General:  alert   Breasts:  Not examined  Lungs: clear to auscultation bilaterally  Heart:  regular rate and rhythm, S1, S2 normal, no murmur, click, rub or gallop  Abdomen: soft, non-tender; bowel sounds normal; no masses,  no organomegaly   Vulva:  not evaluated  Vagina: not evaluated  Cervix:  not evaluated  Corpus: not examined  Adnexa:  not evaluated  Rectal Exam: Not performed.        Assessment:    Nl postpartum exam.  Plan:   1. Contraception: oral progesterone-only contraceptive. R/B/U and back up method reviewed with pt 2. Return to nl ADL's 3. Follow up in: 1 year or as needed.

## 2017-05-11 ENCOUNTER — Encounter: Payer: Self-pay | Admitting: Obstetrics and Gynecology

## 2017-05-12 ENCOUNTER — Other Ambulatory Visit: Payer: Self-pay | Admitting: *Deleted

## 2017-05-12 DIAGNOSIS — Z30011 Encounter for initial prescription of contraceptive pills: Secondary | ICD-10-CM

## 2017-05-12 MED ORDER — NORETHINDRONE 0.35 MG PO TABS: 1.0000 | ORAL_TABLET | Freq: Every day | ORAL | 11 refills | Status: DC

## 2017-05-12 NOTE — Progress Notes (Signed)
Pt needed BC pills reordered to pharmacy as they were put back in stock before pt could pick up.

## 2017-06-08 ENCOUNTER — Ambulatory Visit: Payer: Medicaid Other | Admitting: Obstetrics and Gynecology

## 2017-09-29 ENCOUNTER — Encounter: Payer: Self-pay | Admitting: Obstetrics and Gynecology

## 2017-11-10 ENCOUNTER — Ambulatory Visit (INDEPENDENT_AMBULATORY_CARE_PROVIDER_SITE_OTHER): Payer: Medicaid Other | Admitting: Obstetrics and Gynecology

## 2017-11-10 ENCOUNTER — Encounter: Payer: Self-pay | Admitting: Obstetrics and Gynecology

## 2017-11-10 VITALS — BP 114/78 | HR 83 | Ht 62.5 in | Wt 174.0 lb

## 2017-11-10 DIAGNOSIS — Z0000 Encounter for general adult medical examination without abnormal findings: Secondary | ICD-10-CM

## 2017-11-10 DIAGNOSIS — Z30013 Encounter for initial prescription of injectable contraceptive: Secondary | ICD-10-CM

## 2017-11-10 DIAGNOSIS — Z308 Encounter for other contraceptive management: Secondary | ICD-10-CM | POA: Insufficient documentation

## 2017-11-10 DIAGNOSIS — Z3202 Encounter for pregnancy test, result negative: Secondary | ICD-10-CM | POA: Diagnosis not present

## 2017-11-10 DIAGNOSIS — Z3042 Encounter for surveillance of injectable contraceptive: Secondary | ICD-10-CM | POA: Diagnosis not present

## 2017-11-10 LAB — POCT URINE PREGNANCY: PREG TEST UR: NEGATIVE

## 2017-11-10 MED ORDER — MEDROXYPROGESTERONE ACETATE 150 MG/ML IM SUSP
150.0000 mg | Freq: Once | INTRAMUSCULAR | Status: AC
  Administered 2017-11-10: 150 mg via INTRAMUSCULAR

## 2017-11-10 NOTE — Progress Notes (Signed)
Depo given at today's visit after Negative UPT obtained. Pt tolerated depo well.  Pt advised to RTO first of December for next injection if she chooses to continue Depo.  Administrations This Visit    medroxyPROGESTERone (DEPO-PROVERA) injection 150 mg    Admin Date 11/10/2017 Action Given Dose 150 mg Route Intramuscular Administered By Lanney Gins, CMA

## 2017-11-10 NOTE — Patient Instructions (Addendum)
Medroxyprogesterone injection [Contraceptive] What is this medicine? MEDROXYPROGESTERONE (me DROX ee proe JES te rone) contraceptive injections prevent pregnancy. They provide effective birth control for 3 months. Depo-subQ Provera 104 is also used for treating pain related to endometriosis. This medicine may be used for other purposes; ask your health care provider or pharmacist if you have questions. COMMON BRAND NAME(S): Depo-Provera, Depo-subQ Provera 104 What should I tell my health care provider before I take this medicine? They need to know if you have any of these conditions: -frequently drink alcohol -asthma -blood vessel disease or a history of a blood clot in the lungs or legs -bone disease such as osteoporosis -breast cancer -diabetes -eating disorder (anorexia nervosa or bulimia) -high blood pressure -HIV infection or AIDS -kidney disease -liver disease -mental depression -migraine -seizures (convulsions) -stroke -tobacco smoker -vaginal bleeding -an unusual or allergic reaction to medroxyprogesterone, other hormones, medicines, foods, dyes, or preservatives -pregnant or trying to get pregnant -breast-feeding How should I use this medicine? Depo-Provera Contraceptive injection is given into a muscle. Depo-subQ Provera 104 injection is given under the skin. These injections are given by a health care professional. You must not be pregnant before getting an injection. The injection is usually given during the first 5 days after the start of a menstrual period or 6 weeks after delivery of a baby. Talk to your pediatrician regarding the use of this medicine in children. Special care may be needed. These injections have been used in female children who have started having menstrual periods. Overdosage: If you think you have taken too much of this medicine contact a poison control center or emergency room at once. NOTE: This medicine is only for you. Do not share this medicine  with others. What if I miss a dose? Try not to miss a dose. You must get an injection once every 3 months to maintain birth control. If you cannot keep an appointment, call and reschedule it. If you wait longer than 13 weeks between Depo-Provera contraceptive injections or longer than 14 weeks between Depo-subQ Provera 104 injections, you could get pregnant. Use another method for birth control if you miss your appointment. You may also need a pregnancy test before receiving another injection. What may interact with this medicine? Do not take this medicine with any of the following medications: -bosentan This medicine may also interact with the following medications: -aminoglutethimide -antibiotics or medicines for infections, especially rifampin, rifabutin, rifapentine, and griseofulvin -aprepitant -barbiturate medicines such as phenobarbital or primidone -bexarotene -carbamazepine -medicines for seizures like ethotoin, felbamate, oxcarbazepine, phenytoin, topiramate -modafinil -St. John's wort This list may not describe all possible interactions. Give your health care provider a list of all the medicines, herbs, non-prescription drugs, or dietary supplements you use. Also tell them if you smoke, drink alcohol, or use illegal drugs. Some items may interact with your medicine. What should I watch for while using this medicine? This drug does not protect you against HIV infection (AIDS) or other sexually transmitted diseases. Use of this product may cause you to lose calcium from your bones. Loss of calcium may cause weak bones (osteoporosis). Only use this product for more than 2 years if other forms of birth control are not right for you. The longer you use this product for birth control the more likely you will be at risk for weak bones. Ask your health care professional how you can keep strong bones. You may have a change in bleeding pattern or irregular periods. Many females stop having    periods while taking this drug. If you have received your injections on time, your chance of being pregnant is very low. If you think you may be pregnant, see your health care professional as soon as possible. Tell your health care professional if you want to get pregnant within the next year. The effect of this medicine may last a long time after you get your last injection. What side effects may I notice from receiving this medicine? Side effects that you should report to your doctor or health care professional as soon as possible: -allergic reactions like skin rash, itching or hives, swelling of the face, lips, or tongue -breast tenderness or discharge -breathing problems -changes in vision -depression -feeling faint or lightheaded, falls -fever -pain in the abdomen, chest, groin, or leg -problems with balance, talking, walking -unusually weak or tired -yellowing of the eyes or skin Side effects that usually do not require medical attention (report to your doctor or health care professional if they continue or are bothersome): -acne -fluid retention and swelling -headache -irregular periods, spotting, or absent periods -temporary pain, itching, or skin reaction at site where injected -weight gain This list may not describe all possible side effects. Call your doctor for medical advice about side effects. You may report side effects to FDA at 1-800-FDA-1088. Where should I keep my medicine? This does not apply. The injection will be given to you by a health care professional. NOTE: This sheet is a summary. It may not cover all possible information. If you have questions about this medicine, talk to your doctor, pharmacist, or health care provider.  2018 Elsevier/Gold Standard (2008-03-10 18:37:56) Health Maintenance, Female Adopting a healthy lifestyle and getting preventive care can go a long way to promote health and wellness. Talk with your health care provider about what schedule  of regular examinations is right for you. This is a good chance for you to check in with your provider about disease prevention and staying healthy. In between checkups, there are plenty of things you can do on your own. Experts have done a lot of research about which lifestyle changes and preventive measures are most likely to keep you healthy. Ask your health care provider for more information. Weight and diet Eat a healthy diet  Be sure to include plenty of vegetables, fruits, low-fat dairy products, and lean protein.  Do not eat a lot of foods high in solid fats, added sugars, or salt.  Get regular exercise. This is one of the most important things you can do for your health. ? Most adults should exercise for at least 150 minutes each week. The exercise should increase your heart rate and make you sweat (moderate-intensity exercise). ? Most adults should also do strengthening exercises at least twice a week. This is in addition to the moderate-intensity exercise.  Maintain a healthy weight  Body mass index (BMI) is a measurement that can be used to identify possible weight problems. It estimates body fat based on height and weight. Your health care provider can help determine your BMI and help you achieve or maintain a healthy weight.  For females 20 years of age and older: ? A BMI below 18.5 is considered underweight. ? A BMI of 18.5 to 24.9 is normal. ? A BMI of 25 to 29.9 is considered overweight. ? A BMI of 30 and above is considered obese.  Watch levels of cholesterol and blood lipids  You should start having your blood tested for lipids and cholesterol at 31 years   of age, then have this test every 5 years.  You may need to have your cholesterol levels checked more often if: ? Your lipid or cholesterol levels are high. ? You are older than 31 years of age. ? You are at high risk for heart disease.  Cancer screening Lung Cancer  Lung cancer screening is recommended for  adults 55-80 years old who are at high risk for lung cancer because of a history of smoking.  A yearly low-dose CT scan of the lungs is recommended for people who: ? Currently smoke. ? Have quit within the past 15 years. ? Have at least a 30-pack-year history of smoking. A pack year is smoking an average of one pack of cigarettes a day for 1 year.  Yearly screening should continue until it has been 15 years since you quit.  Yearly screening should stop if you develop a health problem that would prevent you from having lung cancer treatment.  Breast Cancer  Practice breast self-awareness. This means understanding how your breasts normally appear and feel.  It also means doing regular breast self-exams. Let your health care provider know about any changes, no matter how small.  If you are in your 20s or 30s, you should have a clinical breast exam (CBE) by a health care provider every 1-3 years as part of a regular health exam.  If you are 40 or older, have a CBE every year. Also consider having a breast X-ray (mammogram) every year.  If you have a family history of breast cancer, talk to your health care provider about genetic screening.  If you are at high risk for breast cancer, talk to your health care provider about having an MRI and a mammogram every year.  Breast cancer gene (BRCA) assessment is recommended for women who have family members with BRCA-related cancers. BRCA-related cancers include: ? Breast. ? Ovarian. ? Tubal. ? Peritoneal cancers.  Results of the assessment will determine the need for genetic counseling and BRCA1 and BRCA2 testing.  Cervical Cancer Your health care provider may recommend that you be screened regularly for cancer of the pelvic organs (ovaries, uterus, and vagina). This screening involves a pelvic examination, including checking for microscopic changes to the surface of your cervix (Pap test). You may be encouraged to have this screening done  every 3 years, beginning at age 21.  For women ages 30-65, health care providers may recommend pelvic exams and Pap testing every 3 years, or they may recommend the Pap and pelvic exam, combined with testing for human papilloma virus (HPV), every 5 years. Some types of HPV increase your risk of cervical cancer. Testing for HPV may also be done on women of any age with unclear Pap test results.  Other health care providers may not recommend any screening for nonpregnant women who are considered low risk for pelvic cancer and who do not have symptoms. Ask your health care provider if a screening pelvic exam is right for you.  If you have had past treatment for cervical cancer or a condition that could lead to cancer, you need Pap tests and screening for cancer for at least 20 years after your treatment. If Pap tests have been discontinued, your risk factors (such as having a new sexual partner) need to be reassessed to determine if screening should resume. Some women have medical problems that increase the chance of getting cervical cancer. In these cases, your health care provider may recommend more frequent screening and Pap tests.    Colorectal Cancer  This type of cancer can be detected and often prevented.  Routine colorectal cancer screening usually begins at 31 years of age and continues through 31 years of age.  Your health care provider may recommend screening at an earlier age if you have risk factors for colon cancer.  Your health care provider may also recommend using home test kits to check for hidden blood in the stool.  A small camera at the end of a tube can be used to examine your colon directly (sigmoidoscopy or colonoscopy). This is done to check for the earliest forms of colorectal cancer.  Routine screening usually begins at age 50.  Direct examination of the colon should be repeated every 5-10 years through 31 years of age. However, you may need to be screened more often if  early forms of precancerous polyps or small growths are found.  Skin Cancer  Check your skin from head to toe regularly.  Tell your health care provider about any new moles or changes in moles, especially if there is a change in a mole's shape or color.  Also tell your health care provider if you have a mole that is larger than the size of a pencil eraser.  Always use sunscreen. Apply sunscreen liberally and repeatedly throughout the day.  Protect yourself by wearing long sleeves, pants, a wide-brimmed hat, and sunglasses whenever you are outside.  Heart disease, diabetes, and high blood pressure  High blood pressure causes heart disease and increases the risk of stroke. High blood pressure is more likely to develop in: ? People who have blood pressure in the high end of the normal range (130-139/85-89 mm Hg). ? People who are overweight or obese. ? People who are African American.  If you are 18-39 years of age, have your blood pressure checked every 3-5 years. If you are 40 years of age or older, have your blood pressure checked every year. You should have your blood pressure measured twice-once when you are at a hospital or clinic, and once when you are not at a hospital or clinic. Record the average of the two measurements. To check your blood pressure when you are not at a hospital or clinic, you can use: ? An automated blood pressure machine at a pharmacy. ? A home blood pressure monitor.  If you are between 55 years and 79 years old, ask your health care provider if you should take aspirin to prevent strokes.  Have regular diabetes screenings. This involves taking a blood sample to check your fasting blood sugar level. ? If you are at a normal weight and have a low risk for diabetes, have this test once every three years after 31 years of age. ? If you are overweight and have a high risk for diabetes, consider being tested at a younger age or more often. Preventing  infection Hepatitis B  If you have a higher risk for hepatitis B, you should be screened for this virus. You are considered at high risk for hepatitis B if: ? You were born in a country where hepatitis B is common. Ask your health care provider which countries are considered high risk. ? Your parents were born in a high-risk country, and you have not been immunized against hepatitis B (hepatitis B vaccine). ? You have HIV or AIDS. ? You use needles to inject street drugs. ? You live with someone who has hepatitis B. ? You have had sex with someone who has hepatitis B. ?   You get hemodialysis treatment. ? You take certain medicines for conditions, including cancer, organ transplantation, and autoimmune conditions.  Hepatitis C  Blood testing is recommended for: ? Everyone born from 1945 through 1965. ? Anyone with known risk factors for hepatitis C.  Sexually transmitted infections (STIs)  You should be screened for sexually transmitted infections (STIs) including gonorrhea and chlamydia if: ? You are sexually active and are younger than 31 years of age. ? You are older than 31 years of age and your health care provider tells you that you are at risk for this type of infection. ? Your sexual activity has changed since you were last screened and you are at an increased risk for chlamydia or gonorrhea. Ask your health care provider if you are at risk.  If you do not have HIV, but are at risk, it may be recommended that you take a prescription medicine daily to prevent HIV infection. This is called pre-exposure prophylaxis (PrEP). You are considered at risk if: ? You are sexually active and do not regularly use condoms or know the HIV status of your partner(s). ? You take drugs by injection. ? You are sexually active with a partner who has HIV.  Talk with your health care provider about whether you are at high risk of being infected with HIV. If you choose to begin PrEP, you should first be  tested for HIV. You should then be tested every 3 months for as long as you are taking PrEP. Pregnancy  If you are premenopausal and you may become pregnant, ask your health care provider about preconception counseling.  If you may become pregnant, take 400 to 800 micrograms (mcg) of folic acid every day.  If you want to prevent pregnancy, talk to your health care provider about birth control (contraception). Osteoporosis and menopause  Osteoporosis is a disease in which the bones lose minerals and strength with aging. This can result in serious bone fractures. Your risk for osteoporosis can be identified using a bone density scan.  If you are 65 years of age or older, or if you are at risk for osteoporosis and fractures, ask your health care provider if you should be screened.  Ask your health care provider whether you should take a calcium or vitamin D supplement to lower your risk for osteoporosis.  Menopause may have certain physical symptoms and risks.  Hormone replacement therapy may reduce some of these symptoms and risks. Talk to your health care provider about whether hormone replacement therapy is right for you. Follow these instructions at home:  Schedule regular health, dental, and eye exams.  Stay current with your immunizations.  Do not use any tobacco products including cigarettes, chewing tobacco, or electronic cigarettes.  If you are pregnant, do not drink alcohol.  If you are breastfeeding, limit how much and how often you drink alcohol.  Limit alcohol intake to no more than 1 drink per day for nonpregnant women. One drink equals 12 ounces of beer, 5 ounces of wine, or 1 ounces of hard liquor.  Do not use street drugs.  Do not share needles.  Ask your health care provider for help if you need support or information about quitting drugs.  Tell your health care provider if you often feel depressed.  Tell your health care provider if you have ever been abused  or do not feel safe at home. This information is not intended to replace advice given to you by your health care provider. Make sure   you discuss any questions you have with your health care provider. Document Released: 09/02/2010 Document Revised: 07/26/2015 Document Reviewed: 11/21/2014 [employer] Interactive Patient Education  2018 [employer]  

## 2017-11-10 NOTE — Progress Notes (Signed)
Patient ID: Christine Harris, female   DOB: [birth year], 32 y.o.   MRN: [id number]  Christine Harris is a 32 y.o. [phone] female here for a routine annual gynecologic exam.  She desires to switch from POP to a different contraceptive method.  Denies abnormal vaginal bleeding, discharge, pelvic pain, problems with intercourse or other gynecologic concerns.    Gynecologic History Patient's last menstrual period was 10/22/2017. Contraception: none Last Pap: 2018. Results were: normal Last mammogram: NA. Results were: NA  Obstetric History OB History  Gravida Para Term Preterm AB Living  3 1 1  0 2 1  SAB TAB Ectopic Multiple Live Births  0 2 0 0 1    # Outcome Date GA Lbr Len/2nd Weight Sex Delivery Anes PTL Lv  3 Term 03/01/17 [redacted]w[redacted]d 23:58 / 00:24 6 lb 3.5 oz (2.821 kg) F Vag-Spont EPI  LIV  2 TAB 2014          1 TAB 06/11/05            Obstetric Comments  Tab Feb 04 2013 failed condoms  About ? 6 weeks    Past Medical History:  Diagnosis Date  . Abscess    surgery consult 2003  . Bacterial vaginosis 12/05/2010  . Delayed menses 03/23/2013   6 week tab dec 5th .    [name] H/O varicella   . H/O: varicose veins   . Medical history non-contributory   . Yeast infection     Past Surgical History:  Procedure Laterality Date  . HERNIA REPAIR  age 32  . TONSILLECTOMY  2010    Current Outpatient Medications on File Prior to Visit  Medication Sig Dispense Refill  . ibuprofen (ADVIL,MOTRIN) 600 MG tablet Take 1 tablet (600 mg total) by mouth every 6 (six) hours. 30 tablet 0  . Prenatal Vit-Fe Fumarate-FA (MULTIVITAMIN-PRENATAL) 27-0.8 MG TABS tablet Take 1 tablet by mouth daily at 12 noon.    . norethindrone (MICRONOR,CAMILA,ERRIN) 0.35 MG tablet Take 1 tablet (0.35 mg total) by mouth daily. (Patient not taking: Reported on 11/10/2017) 1 Package 11   No current facility-administered medications on file prior to visit.     Allergies  Allergen Reactions  . Codeine Itching    Social  History   Socioeconomic History  . Marital status: Single    Spouse name: Not on file  . Number of children: Not on file  . Years of education: Not on file  . Highest education level: Not on file  Occupational History  . Not on file  Social Needs  . Financial resource strain: Not on file  . Food insecurity:    Worry: Not on file    Inability: Not on file  . Transportation needs:    Medical: Not on file    Non-medical: Not on file  Tobacco Use  . Smoking status: Never Smoker  . Smokeless tobacco: Never Used  Substance and Sexual Activity  . Alcohol use: Yes    Alcohol/week: 0.0 standard drinks  . Drug use: No  . Sexual activity: Yes    Birth control/protection: None  Lifestyle  . Physical activity:    Days per week: Not on file    Minutes per session: Not on file  . Stress: Not on file  Relationships  . Social connections:    Talks on phone: Not on file    Gets together: Not on file    Attends religious service: Not on file    Active member of club  or organization: Not on file    Attends meetings of clubs or organizations: Not on file    Relationship status: Not on file  . Intimate partner violence:    Fear of current or ex partner: Not on file    Emotionally abused: Not on file    Physically abused: Not on file    Forced sexual activity: Not on file  Other Topics Concern  . 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 Weenie dog.    2 cokes per week.     Family History  Problem Relation Age of Onset  . Diabetes Maternal Grandmother   . Colon cancer Maternal Grandmother   . Hyperlipidemia Maternal Grandmother   . Heart disease Maternal Grandmother   . Cancer Maternal Grandmother   . Diabetes Maternal Grandfather   . Asthma Paternal Grandmother   . Heart disease Paternal Grandmother   . Hyperlipidemia Paternal Grandmother   . Stroke Paternal  Grandmother   . Hypertension Mother   . Hypertension Father     The following portions of the patient's history were reviewed and updated as appropriate: allergies, current medications, past family history, past medical history, past social history, past surgical history and problem list.  Review of Systems Pertinent items noted in HPI and remainder of comprehensive ROS otherwise negative.   Objective:  BP 114/78   Pulse 83   Ht 5' 2.5" (1.588 m)   Wt 174 lb (78.9 kg)   LMP 10/22/2017   BMI 31.32 kg/m  CONSTITUTIONAL: Well-developed, well-nourished female in no acute distress.  HENT:  Normocephalic, atraumatic, External right and left ear normal. Oropharynx is clear and moist EYES: Conjunctivae and EOM are normal. Pupils are equal, round, and reactive to light. No scleral icterus.  NECK: Normal range of motion, supple, no masses.  Normal thyroid.  SKIN: Skin is warm and dry. No rash noted. Not diaphoretic. No erythema. No pallor. NEUROLGIC: Alert and oriented to person, place, and time. Normal reflexes, muscle tone coordination. No cranial nerve deficit noted. PSYCHIATRIC: Normal mood and affect. Normal behavior. Normal judgment and thought content. CARDIOVASCULAR: Normal heart rate noted, regular rhythm RESPIRATORY: Clear to auscultation bilaterally. Effort and breath sounds normal, no problems with respiration noted. BREASTS: Symmetric in size. No masses, skin changes, nipple drainage, or lymphadenopathy. ABDOMEN: Soft, normal bowel sounds, no distention noted.  No tenderness, rebound or guarding.  PELVIC: Normal appearing external genitalia;  abnormal discharge noted.    Normal uterine size, no other palpable masses, no uterine or adnexal tenderness. MUSCULOSKELETAL: Normal range of motion. No tenderness.  No cyanosis, clubbing, or edema.  2+ distal pulses.   Assessment:  Annual gynecologic examination  Contraceptive management  Plan:  Pap smear not indicated. Discussed  choices of contraception. R/B/Use of each reviewed. Following discussion, pt would like to try Depo Provera  For now but is considering IUD. IUD information provided to pt. UPT negative today Depo Provera given. Will let the office know if she desires to continue and Rx will be sent to pharmacy. Routine preventative health maintenance measures emphasized. Please refer to After Visit Summary for other counseling recommendations.    Hermina Staggers, MD, FACOG Attending Obstetrician & Gynecologist Center for [facility], [facility]

## 2018-01-04 ENCOUNTER — Encounter: Payer: Self-pay | Admitting: Advanced Practice Midwife

## 2018-01-04 ENCOUNTER — Other Ambulatory Visit (HOSPITAL_COMMUNITY)
Admission: RE | Admit: 2018-01-04 | Discharge: 2018-01-04 | Disposition: A | Discharge status: Home / Self Care | Payer: 59 | Source: Ambulatory Visit | Attending: Advanced Practice Midwife | Admitting: Advanced Practice Midwife

## 2018-01-04 ENCOUNTER — Ambulatory Visit (INDEPENDENT_AMBULATORY_CARE_PROVIDER_SITE_OTHER): Payer: 59 | Admitting: Advanced Practice Midwife

## 2018-01-04 VITALS — BP 120/78 | Wt 177.1 lb

## 2018-01-04 DIAGNOSIS — Z113 Encounter for screening for infections with a predominantly sexual mode of transmission: Secondary | ICD-10-CM

## 2018-01-04 DIAGNOSIS — N760 Acute vaginitis: Secondary | ICD-10-CM

## 2018-01-04 DIAGNOSIS — B9689 Other specified bacterial agents as the cause of diseases classified elsewhere: Secondary | ICD-10-CM

## 2018-01-04 DIAGNOSIS — Z3042 Encounter for surveillance of injectable contraceptive: Secondary | ICD-10-CM

## 2018-01-04 MED ORDER — METRONIDAZOLE 0.75 % VA GEL: 1.0000 | Freq: Every day | VAGINAL | 2 refills | Status: DC

## 2018-01-04 NOTE — Progress Notes (Signed)
Pt is currently on depo contraception (first injection given 11/10/17). Pt is here with c/o bleeding and abnormal odor.

## 2018-01-04 NOTE — Progress Notes (Signed)
  GYNECOLOGY PROGRESS NOTE  History:  32 y.o. [id number] presents to [facility] [address] office today for problem gyn visit. She reports long menses lasting 2 weeks now resolved with associated vaginal discharge with odor.  She is using Depo for contraception and does not have periods usually. She has hx of recurrent BV and thinks this is causing her symptoms but would like STD testing, vaginal swabs only.  She denies h/a, dizziness, shortness of breath, n/v, or fever/chills.    The following portions of the patient's history were reviewed and updated as appropriate: allergies, current medications, past family history, past medical history, past social history, past surgical history and problem list. Last pap smear on 09/01/16 was normal.  Review of Systems:  Pertinent items are noted in HPI.   Objective:  Physical Exam Blood pressure 120/78, weight 80.3 kg, unknown if currently breastfeeding. VS reviewed, nursing note reviewed,  Constitutional: well developed, well nourished, no distress HEENT: normocephalic CV: normal rate Pulm/chest wall: normal effort Breast Exam: deferred Abdomen: soft Neuro: alert and oriented x 3 Skin: warm, dry Psych: affect normal Pelvic exam: Cervix pink, visually closed, without lesion, scant white creamy discharge, vaginal walls and external genitalia normal Bimanual exam: Cervix 0/long/high, firm, anterior, neg CMT, uterus nontender, nonenlarged, adnexa without tenderness, enlargement, or mass  Assessment & Plan:  1. Screen for STD (sexually transmitted disease) - Cervicovaginal ancillary only  2. Bacterial vaginosis --Teaching done about BV prevention - metroNIDAZOLE (METROGEL) 0.75 % vaginal gel; Place 1 Applicatorful vaginally at bedtime. Apply one applicatorful to vagina at bedtime for 5 days  Dispense: 70 g; Refill: 2   Sharen Counter, CNM 4:38 PM

## 2018-01-04 NOTE — Patient Instructions (Signed)
Some natural remedies/prevention to try for bacterial vaginosis: °Take a probiotic tablet/capsule every day for at least 1-2 months.   °Whenever you have symptoms, use boric acid suppositories vaginally every night for a week.   °Do not use scented soaps/perfumes in the vaginal area and wear breathable cotton underwear and do not wear tight restrictive clothing. ° ° °

## 2018-01-05 DIAGNOSIS — Z3042 Encounter for surveillance of injectable contraceptive: Secondary | ICD-10-CM

## 2018-01-05 HISTORY — DX: Encounter for surveillance of injectable contraceptive: Z30.42

## 2018-01-05 LAB — CERVICOVAGINAL ANCILLARY ONLY
Bacterial vaginitis: POSITIVE — AB
Candida vaginitis: NEGATIVE
Chlamydia: NEGATIVE
Neisseria Gonorrhea: NEGATIVE
TRICH (WINDOWPATH): NEGATIVE

## 2018-02-02 ENCOUNTER — Ambulatory Visit (INDEPENDENT_AMBULATORY_CARE_PROVIDER_SITE_OTHER): Payer: 59

## 2018-02-02 DIAGNOSIS — Z30013 Encounter for initial prescription of injectable contraceptive: Secondary | ICD-10-CM | POA: Diagnosis not present

## 2018-02-02 MED ORDER — MEDROXYPROGESTERONE ACETATE 150 MG/ML IM SUSP: 150.0000 mg | Freq: Once | INTRAMUSCULAR | Status: DC

## 2018-02-02 MED ORDER — MEDROXYPROGESTERONE ACETATE 150 MG/ML IM SUSP: 150.0000 mg | INTRAMUSCULAR | 4 refills | Status: DC

## 2018-02-02 MED ORDER — MEDROXYPROGESTERONE ACETATE 150 MG/ML IM SUSP
150.0000 mg | INTRAMUSCULAR | Status: DC
  Administered 2018-02-02: 150 mg via INTRAMUSCULAR

## 2018-02-02 NOTE — Progress Notes (Signed)
Nurse visit for office supplied Depo. Pt is within her window. Depo given L upper outer quad w/o difficulty. Pt wishes to continue Depo. Initial Rx sent to pharmacy for pt to bring next visit.

## 2018-04-22 ENCOUNTER — Other Ambulatory Visit: Payer: Self-pay

## 2018-04-22 ENCOUNTER — Ambulatory Visit: Payer: 59

## 2018-04-22 ENCOUNTER — Emergency Department (HOSPITAL_BASED_OUTPATIENT_CLINIC_OR_DEPARTMENT_OTHER): Payer: 59

## 2018-04-22 ENCOUNTER — Emergency Department (HOSPITAL_BASED_OUTPATIENT_CLINIC_OR_DEPARTMENT_OTHER)
Admission: EM | Admit: 2018-04-22 | Discharge: 2018-04-22 | Disposition: A | Discharge status: Home / Self Care | Payer: 59 | Attending: Emergency Medicine | Admitting: Emergency Medicine

## 2018-04-22 ENCOUNTER — Encounter (HOSPITAL_BASED_OUTPATIENT_CLINIC_OR_DEPARTMENT_OTHER): Payer: Self-pay | Admitting: Emergency Medicine

## 2018-04-22 DIAGNOSIS — R69 Illness, unspecified: Secondary | ICD-10-CM

## 2018-04-22 DIAGNOSIS — J111 Influenza due to unidentified influenza virus with other respiratory manifestations: Secondary | ICD-10-CM | POA: Diagnosis not present

## 2018-04-22 DIAGNOSIS — Z79899 Other long term (current) drug therapy: Secondary | ICD-10-CM | POA: Insufficient documentation

## 2018-04-22 DIAGNOSIS — R509 Fever, unspecified: Secondary | ICD-10-CM | POA: Diagnosis present

## 2018-04-22 MED ORDER — IBUPROFEN 800 MG PO TABS
800.0000 mg | ORAL_TABLET | Freq: Once | ORAL | Status: AC
  Administered 2018-04-22: 800 mg via ORAL
  Filled 2018-04-22: qty 1

## 2018-04-22 MED ORDER — BENZONATATE 200 MG PO CAPS: 200.0000 mg | ORAL_CAPSULE | Freq: Three times a day (TID) | ORAL | 0 refills | Status: AC

## 2018-04-22 MED ORDER — ACETAMINOPHEN 325 MG PO TABS
650.0000 mg | ORAL_TABLET | Freq: Once | ORAL | Status: AC | PRN
  Administered 2018-04-22: 650 mg via ORAL
  Filled 2018-04-22: qty 2

## 2018-04-22 MED ORDER — OSELTAMIVIR PHOSPHATE 75 MG PO CAPS: 75.0000 mg | ORAL_CAPSULE | Freq: Two times a day (BID) | ORAL | 0 refills | Status: DC

## 2018-04-22 NOTE — ED Triage Notes (Signed)
Cough, headache, body aches x 2 days.

## 2018-04-22 NOTE — Discharge Instructions (Addendum)
Home to rest, push hydrating fluids. Motrin/Tylenol as needed as directed. Tessalon as prescribed for cough. Tamiflu as prescribed, discontinue if you develop side effects.

## 2018-04-22 NOTE — ED Provider Notes (Signed)
[facility] EMERGENCY DEPARTMENT Provider Note   CSN: [id number] Arrival date & time: 04/22/18  1340    History   Chief Complaint Chief Complaint  Patient presents with  . flu like symptoms    HPI Sharyon CableLanika N Derossett is a 33 y.o. female.     33 year old female presents with complaint of fever, cough, congestion, sneezing, sore throat, body aches.  Patient states symptoms started on Monday night, recently exposed to her daughter who had similar symptoms.  Patient is a non-smoker, no history of asthma, no history of chronic lung disease.  Patient has been taking OTC cold medication with limited relief.  No other complaints or concerns.     Past Medical History:  Diagnosis Date  . Abscess    surgery consult 2003  . Bacterial vaginosis 12/05/2010  . Delayed menses 03/23/2013   6 week tab dec 5th .    [name]. H/O varicella   . H/O: varicose veins   . Medical history non-contributory   . Yeast infection     Patient Active Problem List   Diagnosis Date Noted  . Depo-Provera contraceptive status 01/05/2018  . Encounter for other contraceptive management 11/10/2017  . Recurrent boils 10/29/2011  . Body piercing 10/29/2011    Past Surgical History:  Procedure Laterality Date  . HERNIA REPAIR  age 89  . TONSILLECTOMY  2010     OB History    Gravida  3   Para  1   Term  1   Preterm  0   AB  2   Living  1     SAB  0   TAB  2   Ectopic  0   Multiple  0   Live Births  1        Obstetric Comments  Tab Feb 04 2013 failed condoms  About ? 6 weeks         Home Medications    Prior to Admission medications   Medication Sig Start Date End Date Taking? Authorizing Provider  benzonatate (TESSALON) 200 MG capsule Take 1 capsule (200 mg total) by mouth every 8 (eight) hours for 10 days. 04/22/18 05/02/18  Jeannie FendMurphy, Rusell Meneely A, PA-C  ibuprofen (ADVIL,MOTRIN) 600 MG tablet Take 1 tablet (600 mg total) by mouth every 6 (six) hours. Patient not taking: Reported on  01/04/2018 03/03/17   Rolm BookbinderMoss, Amber, DO  medroxyPROGESTERone (DEPO-PROVERA) 150 MG/ML injection Inject 1 mL (150 mg total) into the muscle every 3 (three) months. 02/02/18   Adam PhenixArnold, James G, MD  metroNIDAZOLE (METROGEL) 0.75 % vaginal gel Place 1 Applicatorful vaginally at bedtime. Apply one applicatorful to vagina at bedtime for 5 days 01/04/18   Leftwich-Kirby, Wilmer FloorLisa A, CNM  oseltamivir (TAMIFLU) 75 MG capsule Take 1 capsule (75 mg total) by mouth every 12 (twelve) hours. 04/22/18   Jeannie FendMurphy, Anthonia Monger A, PA-C  Prenatal Vit-Fe Fumarate-FA (MULTIVITAMIN-PRENATAL) 27-0.8 MG TABS tablet Take 1 tablet by mouth daily at 12 noon.    [provider]    Family History Family History  Problem Relation Age of Onset  . Diabetes Maternal Grandmother   . Colon cancer Maternal Grandmother   . Hyperlipidemia Maternal Grandmother   . Heart disease Maternal Grandmother   . Cancer Maternal Grandmother   . Diabetes Maternal Grandfather   . Asthma Paternal Grandmother   . Heart disease Paternal Grandmother   . Hyperlipidemia Paternal Grandmother   . Stroke Paternal Grandmother   . Hypertension Mother   . Hypertension Father  Social History Social History   Tobacco Use  . Smoking status: Never Smoker  . Smokeless tobacco: Never Used  Substance Use Topics  . Alcohol use: Yes    Alcohol/week: 0.0 standard drinks  . Drug use: No     Allergies   Codeine   Review of Systems Review of Systems  Constitutional: Positive for chills, diaphoresis and fever.  HENT: Positive for congestion, sneezing and sore throat. Negative for ear pain, sinus pressure and sinus pain.   Eyes: Negative for discharge and redness.  Respiratory: Positive for cough. Negative for shortness of breath and wheezing.   Gastrointestinal: Negative for nausea and vomiting.  Musculoskeletal: Positive for arthralgias and myalgias.  Skin: Negative for rash and wound.  Allergic/Immunologic: Negative for immunocompromised state.    Neurological: Negative for dizziness and weakness.  All other systems reviewed and are negative.    Physical Exam Updated Vital Signs BP 107/64 (BP Location: Left Arm)   Pulse (!) 120   Temp (!) 102.8 F (39.3 C) Comment: EDP made aware, okay to d/c home  Resp 18   Ht 5\' 2"  (1.575 m)   Wt 78 kg   LMP 04/14/2018   SpO2 100%   BMI 31.46 kg/m   Physical Exam Vitals signs and nursing note reviewed.  Constitutional:      General: She is not in acute distress.    Appearance: Normal appearance. She is well-developed. She is not diaphoretic.  HENT:     Head: Normocephalic and atraumatic.     Right Ear: Tympanic membrane and ear canal normal.     Left Ear: Ear canal normal.     Nose: Nose normal. No congestion.     Mouth/Throat:     Mouth: Mucous membranes are moist.     Pharynx: Posterior oropharyngeal erythema present. No oropharyngeal exudate or uvula swelling.     Tonsils: No tonsillar exudate or tonsillar abscesses. Swelling: 1+ on the right. 1+ on the left.  Eyes:     Conjunctiva/sclera: Conjunctivae normal.  Neck:     Musculoskeletal: Neck supple.  Cardiovascular:     Rate and Rhythm: Regular rhythm. Tachycardia present.     Pulses: Normal pulses.     Heart sounds: Normal heart sounds.  Pulmonary:     Effort: Pulmonary effort is normal.     Breath sounds: Normal breath sounds.  Lymphadenopathy:     Cervical: No cervical adenopathy.  Skin:    General: Skin is warm and dry.     Findings: No erythema or rash.  Neurological:     Mental Status: She is alert and oriented to person, place, and time.  Psychiatric:        Behavior: Behavior normal.      ED Treatments / Results  Labs (all labs ordered are listed, but only abnormal results are displayed) Labs Reviewed - No data to display  EKG None  Radiology Dg Chest 2 View  Result Date: 04/22/2018 CLINICAL DATA:  Cough, fever, chills, body aches, and shortness of breath for 3 days. EXAM: CHEST - 2 VIEW  COMPARISON:  None. FINDINGS: The cardiomediastinal silhouette is within normal limits. There is mild peribronchial thickening. No confluent airspace opacity, overt pulmonary edema, pleural effusion, pneumothorax is identified. No acute osseous abnormality is seen. IMPRESSION: Mild bronchitic changes. Electronically Signed   By: Sebastian Ache M.D.   On: 04/22/2018 14:21    Procedures Procedures (including critical care time)  Medications Ordered in ED Medications  acetaminophen (TYLENOL) tablet 650 mg (  650 mg Oral Given 04/22/18 1357)  ibuprofen (ADVIL,MOTRIN) tablet 800 mg (800 mg Oral Given 04/22/18 1445)     Initial Impression / Assessment and Plan / ED Course  I have reviewed the triage vital signs and the nursing notes.  Pertinent labs & imaging results that were available during my care of the patient were reviewed by me and considered in my medical decision making (see chart for details).  Clinical Course as of Apr 22 1514  Thu Apr 22, 2018  [id number] 33 year old female presents with complaint of flulike symptoms, exposed to her daughter who had similar symptoms recently.  Patient is well-appearing, febrile, tachycardic.  On exam has mild posterior pharyngeal erythema, no tender cervical lymphadenopathy.  Offered patient Tamiflu, discussed side effects.  Recommend hydrating fluids and Motrin and Tylenol.  Given prescription for Tessalon for cough.  Advised return to ER for new or worsening symptoms otherwise follow-up with PCP.  Chest x-ray shows bronchitic changes, no pneumonia.  Lung sounds are clear, no wheezing, no complaints of wheezing or shortness of breath.   [LM]    Clinical Course User Index [LM] Jeannie Fend, PA-C    Final Clinical Impressions(s) / ED Diagnoses   Final diagnoses:  Influenza-like illness    ED Discharge Orders         Ordered    oseltamivir (TAMIFLU) 75 MG capsule  Every 12 hours     04/22/18 1429    benzonatate (TESSALON) 200 MG capsule  Every 8 hours      04/22/18 1429           Jeannie Fend, PA-C 04/22/18 1516    Maia Plan, MD 04/22/18 2021

## 2018-11-23 ENCOUNTER — Other Ambulatory Visit (HOSPITAL_COMMUNITY)
Admission: RE | Admit: 2018-11-23 | Discharge: 2018-11-23 | Disposition: A | Discharge status: Home / Self Care | Payer: 59 | Source: Ambulatory Visit | Attending: Women's Health | Admitting: Women's Health

## 2018-11-23 ENCOUNTER — Other Ambulatory Visit: Payer: Self-pay

## 2018-11-23 ENCOUNTER — Ambulatory Visit (INDEPENDENT_AMBULATORY_CARE_PROVIDER_SITE_OTHER): Payer: 59 | Admitting: Women's Health

## 2018-11-23 ENCOUNTER — Encounter: Payer: Self-pay | Admitting: Women's Health

## 2018-11-23 ENCOUNTER — Other Ambulatory Visit: Payer: Self-pay | Admitting: Women's Health

## 2018-11-23 VITALS — BP 110/72 | HR 73 | Wt 184.0 lb

## 2018-11-23 DIAGNOSIS — Z30430 Encounter for insertion of intrauterine contraceptive device: Secondary | ICD-10-CM | POA: Diagnosis not present

## 2018-11-23 DIAGNOSIS — B373 Candidiasis of vulva and vagina: Secondary | ICD-10-CM

## 2018-11-23 DIAGNOSIS — Z01419 Encounter for gynecological examination (general) (routine) without abnormal findings: Secondary | ICD-10-CM

## 2018-11-23 DIAGNOSIS — Z113 Encounter for screening for infections with a predominantly sexual mode of transmission: Secondary | ICD-10-CM

## 2018-11-23 DIAGNOSIS — B3731 Acute candidiasis of vulva and vagina: Secondary | ICD-10-CM

## 2018-11-23 LAB — POCT URINE PREGNANCY: Preg Test, Ur: NEGATIVE

## 2018-11-23 MED ORDER — FLUCONAZOLE 150 MG PO TABS: 150.0000 mg | ORAL_TABLET | Freq: Once | ORAL | 0 refills | Status: AC

## 2018-11-23 NOTE — Patient Instructions (Addendum)
Levonorgestrel intrauterine device (IUD) What is this medicine? LEVONORGESTREL IUD (LEE voe nor jes trel) is a contraceptive (birth control) device. The device is placed inside the uterus by a healthcare professional. It is used to prevent pregnancy. This device can also be used to treat heavy bleeding that occurs during your period. This medicine may be used for other purposes; ask your health care provider or pharmacist if you have questions. COMMON BRAND NAME(S): Cameron Ali What should I tell my health care provider before I take this medicine? They need to know if you have any of these conditions: abnormal Pap smear cancer of the breast, uterus, or cervix diabetes endometritis genital or pelvic infection now or in the past have more than one sexual partner or your partner has more than one partner heart disease history of an ectopic or tubal pregnancy immune system problems IUD in place liver disease or tumor problems with blood clots or take blood-thinners seizures use intravenous drugs uterus of unusual shape vaginal bleeding that has not been explained an unusual or allergic reaction to levonorgestrel, other hormones, silicone, or polyethylene, medicines, foods, dyes, or preservatives pregnant or trying to get pregnant breast-feeding How should I use this medicine? This device is placed inside the uterus by a health care professional. Talk to your pediatrician regarding the use of this medicine in children. Special care may be needed. Overdosage: If you think you have taken too much of this medicine contact a poison control center or emergency room at once. NOTE: This medicine is only for you. Do not share this medicine with others. What if I miss a dose? This does not apply. Depending on the brand of device you have inserted, the device will need to be replaced every 3 to 6 years if you wish to continue using this type of birth control. What may interact  with this medicine? Do not take this medicine with any of the following medications: amprenavir bosentan fosamprenavir This medicine may also interact with the following medications: aprepitant armodafinil barbiturate medicines for inducing sleep or treating seizures bexarotene boceprevir griseofulvin medicines to treat seizures like carbamazepine, ethotoin, felbamate, oxcarbazepine, phenytoin, topiramate modafinil pioglitazone rifabutin rifampin rifapentine some medicines to treat HIV infection like atazanavir, efavirenz, indinavir, lopinavir, nelfinavir, tipranavir, ritonavir St. John's wort warfarin This list may not describe all possible interactions. Give your health care provider a list of all the medicines, herbs, non-prescription drugs, or dietary supplements you use. Also tell them if you smoke, drink alcohol, or use illegal drugs. Some items may interact with your medicine. What should I watch for while using this medicine? Visit your doctor or health care professional for regular check ups. See your doctor if you or your partner has sexual contact with others, becomes HIV positive, or gets a sexual transmitted disease. This product does not protect you against HIV infection (AIDS) or other sexually transmitted diseases. You can check the placement of the IUD yourself by reaching up to the top of your vagina with clean fingers to feel the threads. Do not pull on the threads. It is a good habit to check placement after each menstrual period. Call your doctor right away if you feel more of the IUD than just the threads or if you cannot feel the threads at all. The IUD may come out by itself. You may become pregnant if the device comes out. If you notice that the IUD has come out use a backup birth control method like condoms and call your  health care provider. Using tampons will not change the position of the IUD and are okay to use during your period. This IUD can be safely  scanned with magnetic resonance imaging (MRI) only under specific conditions. Before you have an MRI, tell your healthcare provider that you have an IUD in place, and which type of IUD you have in place. What side effects may I notice from receiving this medicine? Side effects that you should report to your doctor or health care professional as soon as possible: allergic reactions like skin rash, itching or hives, swelling of the face, lips, or tongue fever, flu-like symptoms genital sores high blood pressure no menstrual period for 6 weeks during use pain, swelling, warmth in the leg pelvic pain or tenderness severe or sudden headache signs of pregnancy stomach cramping sudden shortness of breath trouble with balance, talking, or walking unusual vaginal bleeding, discharge yellowing of the eyes or skin Side effects that usually do not require medical attention (report to your doctor or health care professional if they continue or are bothersome): acne breast pain change in sex drive or performance changes in weight cramping, dizziness, or faintness while the device is being inserted headache irregular menstrual bleeding within first 3 to 6 months of use nausea This list may not describe all possible side effects. Call your doctor for medical advice about side effects. You may report side effects to FDA at 1-800-FDA-1088. Where should I keep my medicine? This does not apply. NOTE: This sheet is a summary. It may not cover all possible information. If you have questions about this medicine, talk to your doctor, pharmacist, or health care provider.  2020 Elsevier/Gold Standard (2017-12-29 13:22:01) Bacterial Vaginosis  Bacterial vaginosis is a vaginal infection that occurs when the normal balance of bacteria in the vagina is disrupted. It results from an overgrowth of certain bacteria. This is the most common vaginal infection among women ages 4415-44. Because bacterial vaginosis  increases your risk for STIs (sexually transmitted infections), getting treated can help reduce your risk for chlamydia, gonorrhea, herpes, and HIV (human immunodeficiency virus). Treatment is also important for preventing complications in pregnant women, because this condition can cause an early (premature) delivery. What are the causes? This condition is caused by an increase in harmful bacteria that are normally present in small amounts in the vagina. However, the reason that the condition develops is not fully understood. What increases the risk? The following factors may make you more likely to develop this condition:  Having a new sexual partner or multiple sexual partners.  Having unprotected sex.  Douching.  Having an intrauterine device (IUD).  Smoking.  Drug and alcohol abuse.  Taking certain antibiotic medicines.  Being pregnant. You cannot get bacterial vaginosis from toilet seats, bedding, swimming pools, or contact with objects around you. What are the signs or symptoms? Symptoms of this condition include:  Grey or white vaginal discharge. The discharge can also be watery or foamy.  A fish-like odor with discharge, especially after sexual intercourse or during menstruation.  Itching in and around the vagina.  Burning or pain with urination. Some women with bacterial vaginosis have no signs or symptoms. How is this diagnosed? This condition is diagnosed based on:  Your medical history.  A physical exam of the vagina.  Testing a sample of vaginal fluid under a microscope to look for a large amount of bad bacteria or abnormal cells. Your health care provider may use a cotton swab or a small wooden spatula  to collect the sample. How is this treated? This condition is treated with antibiotics. These may be given as a pill, a vaginal cream, or a medicine that is put into the vagina (suppository). If the condition comes back after treatment, a second round of  antibiotics may be needed. Follow these instructions at home: Medicines  Take over-the-counter and prescription medicines only as told by your health care provider.  Take or use your antibiotic as told by your health care provider. Do not stop taking or using the antibiotic even if you start to feel better. General instructions  If you have a female sexual partner, tell her that you have a vaginal infection. She should see her health care provider and be treated if she has symptoms. If you have a female sexual partner, he does not need treatment.  During treatment: ? Avoid sexual activity until you finish treatment. ? Do not douche. ? Avoid alcohol as directed by your health care provider. ? Avoid breastfeeding as directed by your health care provider.  Drink enough water and fluids to keep your urine clear or pale yellow.  Keep the area around your vagina and rectum clean. ? Wash the area daily with warm water. ? Wipe yourself from front to back after using the toilet.  Keep all follow-up visits as told by your health care provider. This is important. How is this prevented?  Do not douche.  Wash the outside of your vagina with warm water only.  Use protection when having sex. This includes latex condoms and dental dams.  Limit how many sexual partners you have. To help prevent bacterial vaginosis, it is best to have sex with just one partner (monogamous).  Make sure you and your sexual partner are tested for STIs.  Wear cotton or cotton-lined underwear.  Avoid wearing tight pants and pantyhose, especially during summer.  Limit the amount of alcohol that you drink.  Do not use any products that contain nicotine or tobacco, such as cigarettes and e-cigarettes. If you need help quitting, ask your health care provider.  Do not use illegal drugs. Where to find more information  Centers for Disease Control and Prevention: AppraiserFraud.fi  American Sexual Health Association  (ASHA): www.ashastd.org  [place].S. Department of Health and Financial controller, Office on Women's Health: DustingSprays.pl or SecuritiesCard.it Contact a health care provider if:  Your symptoms do not improve, even after treatment.  You have more discharge or pain when urinating.  You have a fever.  You have pain in your abdomen.  You have pain during sex.  You have vaginal bleeding between periods. Summary  Bacterial vaginosis is a vaginal infection that occurs when the normal balance of bacteria in the vagina is disrupted.  Because bacterial vaginosis increases your risk for STIs (sexually transmitted infections), getting treated can help reduce your risk for chlamydia, gonorrhea, herpes, and HIV (human immunodeficiency virus). Treatment is also important for preventing complications in pregnant women, because the condition can cause an early (premature) delivery.  This condition is treated with antibiotic medicines. These may be given as a pill, a vaginal cream, or a medicine that is put into the vagina (suppository). This information is not intended to replace advice given to you by your health care provider. Make sure you discuss any questions you have with your health care provider. Document Released: 02/17/2005 Document Revised: 01/30/2017 Document Reviewed: 11/03/2015 Elsevier Patient Education  2020 [place] Breast self-awareness means being familiar with how your breasts look and  feel. It involves checking your breasts regularly and reporting any changes to your health care provider. Practicing breast self-awareness is important. Sometimes changes may not be harmful (are benign), but sometimes a change in your breasts can be a sign of a serious medical problem. It is important to learn how to do this procedure correctly so that you can catch problems early, when treatment is more likely to be successful. All  women should practice breast self-awareness, including women who have had breast implants. What you need:  A mirror.  A well-lit room. How to do a breast self-exam A breast self-exam is one way to learn what is normal for your breasts and whether your breasts are changing. To do a breast self-exam: Look for changes  1. Remove all the clothing above your waist. 2. Stand in front of a mirror in a room with good lighting. 3. Put your hands on your hips. 4. Push your hands firmly downward. 5. Compare your breasts in the mirror. Look for differences between them (asymmetry), such as: ? Differences in shape. ? Differences in size. ? Puckers, dips, and bumps in one breast and not the other. 6. Look at each breast for changes in the skin, such as: ? Redness. ? Scaly areas. 7. Look for changes in your nipples, such as: ? Discharge. ? Bleeding. ? Dimpling. ? Redness. ? A change in position. Feel for changes Carefully feel your breasts for lumps and changes. It is best to do this while lying on your back on the floor, and again while sitting or standing in the tub or shower with soapy water on your skin. Feel each breast in the following way: 1. Place the arm on the side of the breast you are examining above your head. 2. Feel your breast with the other hand. 3. Start in the nipple area and make -inch (2 cm) overlapping circles to feel your breast. Use the pads of your three middle fingers to do this. Apply light pressure, then medium pressure, then firm pressure. The light pressure will allow you to feel the tissue closest to the skin. The medium pressure will allow you to feel the tissue that is a little deeper. The firm pressure will allow you to feel the tissue close to the ribs. 4. Continue the overlapping circles, moving downward over the breast until you feel your ribs below your breast. 5. Move one finger-width toward the center of the body. Continue to use the -inch (2 cm)  overlapping circles to feel your breast as you move slowly up toward your collarbone. 6. Continue the up-and-down exam using all three pressures until you reach your armpit.  Write down what you find Writing down what you find can help you remember what to discuss with your health care provider. Write down:  What is normal for each breast.  Any changes that you find in each breast, including: ? The kind of changes you find. ? Any pain or tenderness. ? Size and location of any lumps.  Where you are in your menstrual cycle, if you are still menstruating. General tips and recommendations  Examine your breasts every month.  If you are breastfeeding, the best time to examine your breasts is after a feeding or after using a breast pump.  If you menstruate, the best time to examine your breasts is 5-7 days after your period. Breasts are generally lumpier during menstrual periods, and it may be more difficult to notice changes.  With time and practice, you  will become more familiar with the variations in your breasts and more comfortable with the exam. Contact a health care provider if you:  See a change in the shape or size of your breasts or nipples.  See a change in the skin of your breast or nipples, such as a reddened or scaly area.  Have unusual discharge from your nipples.  Find a lump or thick area that was not there before.  Have pain in your breasts.  Have any concerns related to your breast health. Summary  Breast self-awareness includes looking for physical changes in your breasts, as well as feeling for any changes within your breasts.  Breast self-awareness should be performed in front of a mirror in a well-lit room.  You should examine your breasts every month. If you menstruate, the best time to examine your breasts is 5-7 days after your menstrual period.  Let your health care provider know of any changes you notice in your breasts, including changes in size,  changes on the skin, pain or tenderness, or unusual fluid from your nipples. This information is not intended to replace advice given to you by your health care provider. Make sure you discuss any questions you have with your health care provider. Document Released: 02/17/2005 Document Revised: 10/06/2017 Document Reviewed: 10/06/2017 Elsevier Patient Education  2020 [employer].

## 2018-11-23 NOTE — Progress Notes (Signed)
RGYN presents for Contraception Consult.   LMP: per pt has not had period in 1 yr due to Depo   *Last Depo 02/02/2018  Pt states last had unprotected intercourse x 1 week ago.  Pt was wants Mirena  IUD  UPT : NEGATIVE  Last pap 09/01/2016 WNL  Last Annual : 11/10/2017

## 2018-11-23 NOTE — Progress Notes (Signed)
GYNECOLOGY ANNUAL PREVENTATIVE CARE ENCOUNTER NOTE  History:     Christine Harris is a 33 y.o. G54P1021 female here for a routine annual gynecologic exam.  Current complaints: recurrent BV and wants to know what preventive measures she can take to prevent it from returning. Pt denies symptoms of BV at this time and reports she is currently on suppressive therapy with MetroGel twice weekly. Denies abnormal vaginal bleeding, discharge, pelvic pain, problems with intercourse or other gynecologic concerns.  Pt reports maternal grandmother had colon and breast cancer, diagnosed in 2s. Pt desires STD testing today. Pt requests counseling on Mirena IUD.                          Gynecologic History No LMP recorded. (Menstrual status: Other). Pt reports no menses in 1 year. Last Depo due 04/2018, but pt did not return for injection citing hair loss and weight gain side effects. Contraception: none - considering Mirena IUD. Last Pap: 08/2016. Results were: normal with HPV not performed d/t age. Last mammogram: n/a d/t age.  Obstetric History OB History  Gravida Para Term Preterm AB Living  3 1 1  0 2 1  SAB TAB Ectopic Multiple Live Births  0 2 0 0 1    # Outcome Date GA Lbr Len/2nd Weight Sex Delivery Anes PTL Lv  3 Term 03/01/17 [redacted]w[redacted]d 23:58 / 00:24 6 lb 3.5 oz (2.821 kg) F Vag-Spont EPI  LIV  2 TAB 2014          1 TAB 06/11/05            Obstetric Comments  Tab Feb 04 2013 failed condoms  About ? 6 weeks    Past Medical History:  Diagnosis Date  . Abscess    surgery consult 2003  . Bacterial vaginosis 12/05/2010  . Delayed menses 03/23/2013   6 week tab dec 5th .    [name] Depo-Provera contraceptive status 01/05/2018  . H/O varicella   . H/O: varicose veins   . Medical history non-contributory   . Yeast infection     Past Surgical History:  Procedure Laterality Date  . HERNIA REPAIR  age 60  . TONSILLECTOMY  2010    Current Outpatient Medications on File Prior to Visit  Medication  Sig Dispense Refill  . ibuprofen (ADVIL,MOTRIN) 600 MG tablet Take 1 tablet (600 mg total) by mouth every 6 (six) hours. (Patient not taking: Reported on 01/04/2018) 30 tablet 0  . metroNIDAZOLE (METROGEL) 0.75 % vaginal gel Place 1 Applicatorful vaginally at bedtime. Apply one applicatorful to vagina at bedtime for 5 days (Patient not taking: Reported on 11/23/2018) 70 g 2  . Prenatal Vit-Fe Fumarate-FA (MULTIVITAMIN-PRENATAL) 27-0.8 MG TABS tablet Take 1 tablet by mouth daily at 12 noon.     No current facility-administered medications on file prior to visit.     Allergies  Allergen Reactions  . Codeine Itching    Social History:  reports that she has never smoked. She has never used smokeless tobacco. She reports current alcohol use. She reports that she does not use drugs.  Family History  Problem Relation Age of Onset  . Diabetes Maternal Grandmother   . Colon cancer Maternal Grandmother   . Hyperlipidemia Maternal Grandmother   . Heart disease Maternal Grandmother   . Cancer Maternal Grandmother   . Diabetes Maternal Grandfather   . Asthma Paternal Grandmother   . Heart disease Paternal Grandmother   .  Hyperlipidemia Paternal Grandmother   . Stroke Paternal Grandmother   . Hypertension Mother   . Hypertension Father     The following portions of the patient's history were reviewed and updated as appropriate: allergies, current medications, past family history, past medical history, past social history, past surgical history and problem list.  Review of Systems Pertinent items noted in HPI and remainder of comprehensive ROS otherwise negative.  Physical Exam:  BP 110/72   Pulse 73   Wt 184 lb (83.5 kg)   BMI 33.65 kg/m  CONSTITUTIONAL: Well-developed, well-nourished female in no acute distress.  HENT:  Normocephalic, atraumatic, External right and left ear normal. Oropharynx is clear and moist EYES: Conjunctivae and EOM are normal. Pupils are equal, round, and reactive  to light. No scleral icterus.  NECK: Normal range of motion, supple, no masses.  Normal thyroid.  SKIN: Skin is warm and dry. No rash noted. Not diaphoretic. No erythema. No pallor. MUSCULOSKELETAL: Normal range of motion. No tenderness.  No cyanosis, clubbing, or edema.  2+ distal pulses. NEUROLOGIC: Alert and oriented to person, place, and time. Normal reflexes, muscle tone coordination. No cranial nerve deficit noted. PSYCHIATRIC: Normal mood and affect. Normal behavior. Normal judgment and thought content. CARDIOVASCULAR: Normal heart rate noted, regular rhythm RESPIRATORY: Clear to auscultation bilaterally. Effort and breath sounds normal, no problems with respiration noted. BREASTS: Asymmetric in size, but pt reports breasts have been asymmetric since puberty, denies any recent breast changes. No masses, skin changes, nipple drainage, or lymphadenopathy. ABDOMEN: Soft, normal bowel sounds, no distention noted.  No tenderness, rebound or guarding.  PELVIC: Normal appearing external genitalia; normal appearing vaginal mucosa and cervix.  Thick, white, clumped discharge noted, pt denies itching.  Pap smear obtained.  Normal uterine size, no other palpable masses, no uterine or adnexal tenderness.   Assessment and Plan:      1. Well woman exam with routine gynecological exam - breast self-awareness reviewed -discussed prevention of recurrent BV, information given - Cytology - PAP( [place])  2. Screen for STD (sexually transmitted disease) - Cervicovaginal ancillary only( [place]) - HIV antibody (with reflex) - RPR - Hepatitis C Antibody - Hepatitis B Surface AntiGEN  3. Encounter for IUD insertion - pt had unprotected intercourse last week, no LMP for one year, unable to insert today d/t inability to be reasonably sure patient is not pregnant, pt to RTC in 3wks after no intercourse or only intercourse with condoms without issue for insertion - POCT urine pregnancy  4. Vaginal  yeast infection - fluconazole (DIFLUCAN) 150 MG tablet; Take 1 tablet (150 mg total) by mouth once for 1 dose.  Dispense: 1 tablet; Refill: 0   Will follow up results of pap smear and manage accordingly. Routine preventative health maintenance measures emphasized. Please refer to After Visit Summary for other counseling recommendations.      Marylen Ponto, NP  4:33 PM 11/23/2018 [facility] [employer], [facility]

## 2018-11-24 LAB — CERVICOVAGINAL ANCILLARY ONLY
Bacterial Vaginitis (gardnerella): POSITIVE — AB
Candida Glabrata: NEGATIVE
Candida Vaginitis: NEGATIVE
Molecular Disclaimer: NEGATIVE
Molecular Disclaimer: NEGATIVE
Molecular Disclaimer: NEGATIVE
Molecular Disclaimer: NORMAL
Trichomonas: NEGATIVE

## 2018-11-24 LAB — HEPATITIS C ANTIBODY: Hep C Virus Ab: 0.1 s/co ratio (ref 0.0–0.9)

## 2018-11-24 LAB — HIV ANTIBODY (ROUTINE TESTING W REFLEX): HIV Screen 4th Generation wRfx: NONREACTIVE

## 2018-11-24 LAB — HEPATITIS B SURFACE ANTIGEN: Hepatitis B Surface Ag: NEGATIVE

## 2018-11-24 LAB — CYTOLOGY - PAP
Adequacy: ABSENT
Diagnosis: NEGATIVE

## 2018-11-24 LAB — RPR: RPR Ser Ql: NONREACTIVE

## 2018-11-25 LAB — CERVICOVAGINAL ANCILLARY ONLY
Chlamydia: NEGATIVE
Neisseria Gonorrhea: NEGATIVE

## 2018-12-13 ENCOUNTER — Telehealth (INDEPENDENT_AMBULATORY_CARE_PROVIDER_SITE_OTHER): Payer: 59 | Admitting: Advanced Practice Midwife

## 2018-12-13 DIAGNOSIS — R87616 Satisfactory cervical smear but lacking transformation zone: Secondary | ICD-10-CM | POA: Insufficient documentation

## 2018-12-13 DIAGNOSIS — B9689 Other specified bacterial agents as the cause of diseases classified elsewhere: Secondary | ICD-10-CM

## 2018-12-13 DIAGNOSIS — N760 Acute vaginitis: Secondary | ICD-10-CM | POA: Diagnosis not present

## 2018-12-13 MED ORDER — METRONIDAZOLE 0.75 % VA GEL: 1.0000 | Freq: Every day | VAGINAL | 5 refills | Status: DC

## 2018-12-13 NOTE — Progress Notes (Signed)
I connected with  Christine Harris on 12/13/18 by a video enabled telemedicine application and verified that I am speaking with the correct person using two identifiers.  GYN, c/o clear malodorous discharge x 1 week. Denies abdominal pain, itching, chills, fever, bleeding, NV.

## 2018-12-13 NOTE — Progress Notes (Signed)
    TELEHEALTH GYNECOLOGY VIRTUAL VIDEO VISIT ENCOUNTER NOTE  Provider location: Center for [facility] at [place]   I connected with Christine Harris on 12/13/18 at  8:55 AM EDT by MyChart Video Encounter at home and verified that I am speaking with the correct person using two identifiers.   I discussed the limitations, risks, security and privacy concerns of performing an evaluation and management service virtually and the availability of in person appointments. I also discussed with the patient that there may be a patient responsible charge related to this service. The patient expressed understanding and agreed to proceed.   History:  Christine Harris is a 33 y.o. [phone] female being evaluated today for recurrent BV with increased vaginal discharge and odor.  It will improve with Metrogel but will return. She is doing preventing treatment with cotton underwear, decreased use of soaps, taking a probiotic intermittently.   She denies any pain or other symptoms. She denies any increased risks for STDs.  The following portions of the patient's history were reviewed and updated as appropriate: allergies, current medications, past family history, past medical history, past social history, past surgical history and problem list. Last pap smear on 11/23/18 was normal with absent transformation zone, repeat Pap in 3 years per ASCCP guidelines.   Past Medical History:  Diagnosis Date  . Abscess    surgery consult 2003  . Bacterial vaginosis 12/05/2010  . Delayed menses 03/23/2013   6 week tab dec 5th .    [name] Depo-Provera contraceptive status 01/05/2018  . H/O varicella   . H/O: varicose veins   . Medical history non-contributory   . Yeast infection    Past Surgical History:  Procedure Laterality Date  . HERNIA REPAIR  age 67  . TONSILLECTOMY  2010    Review of Systems:  Pertinent items noted in HPI and remainder of comprehensive ROS otherwise negative.  Physical Exam:   General:   Alert, oriented and cooperative. Patient appears to be in no acute distress.  Mental Status: Normal mood and affect. Normal behavior. Normal judgment and thought content.   Respiratory: Normal respiratory effort, no problems with respiration noted  Rest of physical exam deferred due to type of encounter  Labs and Imaging No results found for this or any previous visit (from the past 336 hour(s)). No results found.     Assessment and Plan:     1. BV (bacterial vaginosis) --Pt to restart and be more consistent with probiotic tablet/capsule daily.   --Rx for Metrogel with refills, pt to use all 5 days of medication when treating, not use 2-3 days then stop the medicine.  --F/U as needed.  2. Transformation zone absent on cervical Pap smear --On chart review, pt with normal Pap but absent transformation zone on 11/23/18. Repeat Pap in 3 years per ASCCP.       I discussed the assessment and treatment plan with the patient. The patient was provided an opportunity to ask questions and all were answered. The patient agreed with the plan and demonstrated an understanding of the instructions.   The patient was advised to call back or seek an in-person evaluation/go to the ED if the symptoms worsen or if the condition fails to improve as anticipated.  I provided 10 minutes of face-to-face time during this encounter.   Fatima Blank, [place] for [facility], [place]

## 2018-12-21 ENCOUNTER — Encounter: Payer: Self-pay | Admitting: Family Medicine

## 2018-12-21 ENCOUNTER — Ambulatory Visit (INDEPENDENT_AMBULATORY_CARE_PROVIDER_SITE_OTHER): Payer: 59 | Admitting: Family Medicine

## 2018-12-21 ENCOUNTER — Other Ambulatory Visit: Payer: Self-pay

## 2018-12-21 VITALS — BP 112/72 | HR 76 | Wt 178.0 lb

## 2018-12-21 DIAGNOSIS — Z30430 Encounter for insertion of intrauterine contraceptive device: Secondary | ICD-10-CM

## 2018-12-21 DIAGNOSIS — Z3202 Encounter for pregnancy test, result negative: Secondary | ICD-10-CM

## 2018-12-21 LAB — POCT URINE PREGNANCY: Preg Test, Ur: NEGATIVE

## 2018-12-21 MED ORDER — LEVONORGESTREL 20 MCG/24HR IU IUD
INTRAUTERINE_SYSTEM | Freq: Once | INTRAUTERINE | Status: AC
  Administered 2018-12-21: 16:00:00 via INTRAUTERINE

## 2018-12-21 NOTE — Progress Notes (Signed)
RGYN patient presents for IUD Insertion   Pt Denies any unprotected intercourse x 14 days.  UPT : NEG

## 2018-12-21 NOTE — Patient Instructions (Addendum)
Levonorgestrel intrauterine device (IUD) What is this medicine? LEVONORGESTREL IUD (LEE voe nor jes trel) is a contraceptive (birth control) device. The device is placed inside the uterus by a healthcare professional. It is used to prevent pregnancy. This device can also be used to treat heavy bleeding that occurs during your period. This medicine may be used for other purposes; ask your health care provider or pharmacist if you have questions. COMMON BRAND NAME(S): Kyleena, LILETTA, Mirena, Skyla What should I tell my health care provider before I take this medicine? They need to know if you have any of these conditions:  abnormal Pap smear  cancer of the breast, uterus, or cervix  diabetes  endometritis  genital or pelvic infection now or in the past  have more than one sexual partner or your partner has more than one partner  heart disease  history of an ectopic or tubal pregnancy  immune system problems  IUD in place  liver disease or tumor  problems with blood clots or take blood-thinners  seizures  use intravenous drugs  uterus of unusual shape  vaginal bleeding that has not been explained  an unusual or allergic reaction to levonorgestrel, other hormones, silicone, or polyethylene, medicines, foods, dyes, or preservatives  pregnant or trying to get pregnant  breast-feeding How should I use this medicine? This device is placed inside the uterus by a health care professional. Talk to your pediatrician regarding the use of this medicine in children. Special care may be needed. Overdosage: If you think you have taken too much of this medicine contact a poison control center or emergency room at once. NOTE: This medicine is only for you. Do not share this medicine with others. What if I miss a dose? This does not apply. Depending on the brand of device you have inserted, the device will need to be replaced every 3 to 6 years if you wish to continue using this type  of birth control. What may interact with this medicine? Do not take this medicine with any of the following medications:  amprenavir  bosentan  fosamprenavir This medicine may also interact with the following medications:  aprepitant  armodafinil  barbiturate medicines for inducing sleep or treating seizures  bexarotene  boceprevir  griseofulvin  medicines to treat seizures like carbamazepine, ethotoin, felbamate, oxcarbazepine, phenytoin, topiramate  modafinil  pioglitazone  rifabutin  rifampin  rifapentine  some medicines to treat HIV infection like atazanavir, efavirenz, indinavir, lopinavir, nelfinavir, tipranavir, ritonavir  St. John's wort  warfarin This list may not describe all possible interactions. Give your health care provider a list of all the medicines, herbs, non-prescription drugs, or dietary supplements you use. Also tell them if you smoke, drink alcohol, or use illegal drugs. Some items may interact with your medicine. What should I watch for while using this medicine? Visit your doctor or health care professional for regular check ups. See your doctor if you or your partner has sexual contact with others, becomes HIV positive, or gets a sexual transmitted disease. This product does not protect you against HIV infection (AIDS) or other sexually transmitted diseases. You can check the placement of the IUD yourself by reaching up to the top of your vagina with clean fingers to feel the threads. Do not pull on the threads. It is a good habit to check placement after each menstrual period. Call your doctor right away if you feel more of the IUD than just the threads or if you cannot feel the threads at   all. The IUD may come out by itself. You may become pregnant if the device comes out. If you notice that the IUD has come out use a backup birth control method like condoms and call your health care provider. Using tampons will not change the position of the  IUD and are okay to use during your period. This IUD can be safely scanned with magnetic resonance imaging (MRI) only under specific conditions. Before you have an MRI, tell your healthcare provider that you have an IUD in place, and which type of IUD you have in place. What side effects may I notice from receiving this medicine? Side effects that you should report to your doctor or health care professional as soon as possible:  allergic reactions like skin rash, itching or hives, swelling of the face, lips, or tongue  fever, flu-like symptoms  genital sores  high blood pressure  no menstrual period for 6 weeks during use  pain, swelling, warmth in the leg  pelvic pain or tenderness  severe or sudden headache  signs of pregnancy  stomach cramping  sudden shortness of breath  trouble with balance, talking, or walking  unusual vaginal bleeding, discharge  yellowing of the eyes or skin Side effects that usually do not require medical attention (report to your doctor or health care professional if they continue or are bothersome):  acne  breast pain  change in sex drive or performance  changes in weight  cramping, dizziness, or faintness while the device is being inserted  headache  irregular menstrual bleeding within first 3 to 6 months of use  nausea This list may not describe all possible side effects. Call your doctor for medical advice about side effects. You may report side effects to FDA at 1-800-FDA-1088. Where should I keep my medicine? This does not apply. NOTE: This sheet is a summary. It may not cover all possible information. If you have questions about this medicine, talk to your doctor, pharmacist, or health care provider.  2020 Elsevier/Gold Standard (2017-12-29 13:22:01)  Intrauterine Device Insertion, Care After  This sheet gives you information about how to care for yourself after your procedure. Your health care provider may also give you more  specific instructions. If you have problems or questions, contact your health care provider. What can I expect after the procedure? After the procedure, it is common to have:  Cramps and pain in the abdomen.  Light bleeding (spotting) or heavier bleeding that is like your menstrual period. This may last for up to a few days.  Lower back pain.  Dizziness.  Headaches.  Nausea. Follow these instructions at home:  Before resuming sexual activity, check to make sure that you can feel the IUD string(s). You should be able to feel the end of the string(s) below the opening of your cervix. If your IUD string is in place, you may resume sexual activity. ? If you had a hormonal IUD inserted more than 7 days after your most recent period started, you will need to use a backup method of birth control for 7 days after IUD insertion. Ask your health care provider whether this applies to you.  Continue to check that the IUD is still in place by feeling for the string(s) after every menstrual period, or once a month.  Take over-the-counter and prescription medicines only as told by your health care provider.  Do not drive or use heavy machinery while taking prescription pain medicine.  Keep all follow-up visits as told by   your health care provider. This is important. Contact a health care provider if:  You have bleeding that is heavier or lasts longer than a normal menstrual cycle.  You have a fever.  You have cramps or abdominal pain that get worse or do not get better with medicine.  You develop abdominal pain that is new or is not in the same area of earlier cramping and pain.  You feel lightheaded or weak.  You have abnormal or bad-smelling discharge from your vagina.  You have pain during sexual activity.  You have any of the following problems with your IUD string(s): ? The string bothers or hurts you or your sexual partner. ? You cannot feel the string. ? The string has gotten  longer.  You can feel the IUD in your vagina.  You think you may be pregnant, or you miss your menstrual period.  You think you may have an STI (sexually transmitted infection). Get help right away if:  You have flu-like symptoms.  You have a fever and chills.  You can feel that your IUD has slipped out of place. Summary  After the procedure, it is common to have cramps and pain in the abdomen. It is also common to have light bleeding (spotting) or heavier bleeding that is like your menstrual period.  Continue to check that the IUD is still in place by feeling for the string(s) after every menstrual period, or once a month.  Keep all follow-up visits as told by your health care provider. This is important.  Contact your health care provider if you have problems with your IUD string(s), such as the string getting longer or bothering you or your sexual partner. This information is not intended to replace advice given to you by your health care provider. Make sure you discuss any questions you have with your health care provider. Document Released: 10/16/2010 Document Revised: 01/30/2017 Document Reviewed: 01/09/2016 Elsevier Patient Education  2020 Elsevier Inc.  

## 2018-12-21 NOTE — Progress Notes (Signed)
    GYNECOLOGY OFFICE PROCEDURE NOTE  Christine Harris is a 33 y.o. [id number] here for [place] IUD insertion. No GYN concerns.  Last pap smear was on 11/23/2018 and was normal.  IUD Insertion Procedure Note Patient identified, informed consent performed, consent signed.   Discussed risks of irregular bleeding, cramping, infection, malpositioning or misplacement of the IUD outside the uterus which may require further procedure such as laparoscopy. Also discussed >99% contraception efficacy, increased risk of ectopic pregnancy with failure of method.  Time out was performed.  Urine pregnancy test negative.  Speculum placed in the vagina.  Cervix visualized.  Cleaned with Betadine x 2.  Grasped anteriorly with a single tooth tenaculum.  Uterus sounded to 6.5 cm.  Mirena IUD placed per manufacturer's recommendations.  Strings trimmed to 3 cm. Tenaculum was removed, good hemostasis noted.  Patient tolerated procedure well.   Patient was given post-procedure instructions.  She was advised to have backup contraception for one week.  Patient was also asked to check IUD strings periodically; she can f/u as desired for string check or other concerns.  Barrington Ellison, MD [facility] Family Medicine Fellow, [facility] for [facility], [place]

## 2019-06-30 ENCOUNTER — Other Ambulatory Visit: Payer: Self-pay

## 2019-07-01 ENCOUNTER — Other Ambulatory Visit: Payer: Self-pay

## 2019-07-01 ENCOUNTER — Ambulatory Visit: Payer: 59 | Admitting: Internal Medicine

## 2019-07-01 ENCOUNTER — Encounter: Payer: Self-pay | Admitting: Internal Medicine

## 2019-07-01 VITALS — BP 122/76 | HR 83 | Temp 98.3°F | Ht 62.5 in | Wt 183.2 lb

## 2019-07-01 DIAGNOSIS — L0293 Carbuncle, unspecified: Secondary | ICD-10-CM | POA: Diagnosis not present

## 2019-07-01 MED ORDER — CLINDAMYCIN PHOSPHATE 1 % EX GEL
Freq: Two times a day (BID) | CUTANEOUS | 1 refills | Status: AC
Start: 2019-07-01 — End: ?

## 2019-07-01 MED ORDER — CLINDAMYCIN HCL 300 MG PO CAPS: 300.0000 mg | ORAL_CAPSULE | Freq: Three times a day (TID) | ORAL | 0 refills | Status: DC

## 2019-07-01 NOTE — Progress Notes (Signed)
This visit occurred during the SARS-CoV-2 public health emergency.  Safety protocols were in place, including screening questions prior to the visit, additional usage of staff PPE, and extensive cleaning of exam room while observing appropriate contact time as indicated for disinfecting solutions.    Chief Complaint  Patient presents with  . Recurrent Skin Infections    in vaginal/groin area and buttock area, painful    HPI: Christine Harris 34 y.o. come in for  Above problem  Last visit was  5 2016  Care has been through obgyne reestablishing and to have cpx in May   She has battled recurrent boil like skin lesions in the perineal area for years   When in middle school had perianal buttock abscess drainged by surgeon. recently has taken care of thses by soaking and  A topical  Thinks get worse with red meat . No itching and no problem in axillar area .   Since last week has had a more bothersome tender area right perineal buttocks area without fever .  But 3 areas of concer.  No shaving over area  No change in bowel habits   Contraception is" implant "  iud placed in 2020 ROS: See pertinent positives and negatives per HPI.  Past Medical History:  Diagnosis Date  . Abscess    surgery consult 2003  . Bacterial vaginosis 12/05/2010  . Delayed menses 03/23/2013   6 week tab dec 5th .    [name] Depo-Provera contraceptive status 01/05/2018  . H/O varicella   . H/O: varicose veins   . Medical history non-contributory   . Yeast infection     Family History  Problem Relation Age of Onset  . Diabetes Maternal Grandmother   . Colon cancer Maternal Grandmother   . Hyperlipidemia Maternal Grandmother   . Heart disease Maternal Grandmother   . Cancer Maternal Grandmother   . Diabetes Maternal Grandfather   . Asthma Paternal Grandmother   . Heart disease Paternal Grandmother   . Hyperlipidemia Paternal Grandmother   . Stroke Paternal Grandmother   . Hypertension Mother   . Hypertension  Father     Social History   Socioeconomic History  . Marital status: Single    Spouse name: Not on file  . Number of children: Not on file  . Years of education: Not on file  . Highest education level: Not on file  Occupational History  . Not on file  Tobacco Use  . Smoking status: Never Smoker  . Smokeless tobacco: Never Used  Substance and Sexual Activity  . Alcohol use: Yes    Alcohol/week: 0.0 standard drinks  . Drug use: No  . Sexual activity: Yes    Birth control/protection: None  Other Topics Concern  . 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 Weenie dog.    2 cokes per week.    Social Determinants of Health   Financial Resource Strain:   . Difficulty of Paying Living Expenses:   Food Insecurity:   . Worried About Charity fundraiser in the Last Year:   . Arboriculturist in the Last Year:   Transportation Needs:   . Film/video editor (Medical):   [name] Lack of Transportation (Non-Medical):   Physical Activity:   . Days of Exercise  per Week:   . Minutes of Exercise per Session:   Stress:   . Feeling of Stress :   Social Connections:   . Frequency of Communication with Friends and Family:   . Frequency of Social Gatherings with Friends and Family:   . Attends Religious Services:   . Active Member of Clubs or Organizations:   . Attends Banker Meetings:   [name] Marital Status:     Outpatient Medications Prior to Visit  Medication Sig Dispense Refill  . ibuprofen (ADVIL,MOTRIN) 600 MG tablet Take 1 tablet (600 mg total) by mouth every 6 (six) hours. 30 tablet 0  . metroNIDAZOLE (METROGEL) 0.75 % vaginal gel Place 1 Applicatorful vaginally at bedtime. Apply one applicatorful to vagina at bedtime for 5 days 70 g 5  . Prenatal Vit-Fe Fumarate-FA (MULTIVITAMIN-PRENATAL) 27-0.8 MG TABS tablet Take 1 tablet by mouth daily at 12 noon.    .  Probiotic Product (CULTURELLE PROBIOTICS PO) Take 1 capsule by mouth daily.    . metroNIDAZOLE (METROGEL) 0.75 % vaginal gel Place 1 Applicatorful vaginally at bedtime. Apply one applicatorful to vagina at bedtime for 5 days (Patient not taking: Reported on 07/01/2019) 70 g 2   No facility-administered medications prior to visit.     EXAM:  BP 122/76   Pulse 83   Temp 98.3 F (36.8 C) (Temporal)   Ht 5' 2.5" (1.588 m)   Wt 183 lb 3.2 oz (83.1 kg)   BMI 32.97 kg/m   Body mass index is 32.97 kg/m.  GENERAL: vitals reviewed and listed above, alert, oriented, appears well hydrated and in no acute distress HEENT: atraumatic, conjunctiva  clear, no obvious abnormalities on inspection of external nose and ears OP : masked  NECK: no obvious masses on inspection palpation  t CV: HRRR, no clubbing cyanosis or  peripheral edema nl cap refill  MS: moves all extremities without noticeable focal  Abnormality Ext gu small boild lef labia with white head  No mass  Left post to anal area buttock with pinpoint drainage area  Slight white  Yellow dc tender  Redness about 1 + cm cannot feel  Significant mass  On palpation  No streaking   Culture taken  No adenopathy   PSYCH: pleasant and cooperative, no obvious depression or anxiety  BP Readings from Last 3 Encounters:  07/01/19 122/76  12/21/18 112/72  11/23/18 110/72    ASSESSMENT AND PLAN:  Discussed the following assessment and plan:  Recurrent boils - Plan: WOUND CULTURE, WOUND CULTURE mrsa noted 2015 culture s past hx   Has been battling  This for years but  wrose  recently   No antibiotic recent  . Will do clindamycin risk benefit  If gets diarrhea let us know .  Consider doxy  Topical clinda and compresses  consider antibacterial  Cl;eansers    Wound culture  Pending    Plan fu ( has appt  In May ) -Patient advised to return or notify health care team  if  new concerns arise. Record review  Patient Instructions  Continue soaking  and antibiotic topical ok  After oral antibiotic  Not sure  Related to beef but  Do what you think best  On that .   Begin antibiotic   pills for now   Get gyne  To check and see if they have other  Management  Ideas.       Neta Mends. Panosh M.D.

## 2019-07-01 NOTE — Patient Instructions (Addendum)
Continue soaking and antibiotic topical ok  After oral antibiotic  Not sure  Related to beef but  Do what you think best  On that .   Begin antibiotic   pills for now   Get gyne  To check and see if they have other  Management  Ideas.

## 2019-07-05 LAB — WOUND CULTURE
MICRO NUMBER:: 10426004
SPECIMEN QUALITY:: ADEQUATE

## 2019-07-05 NOTE — Progress Notes (Signed)
Culture grew out a staph bacteria but not a resistant one which is good news.  If tolerating medication finish this and can use topicals  Let us know if not tolerating antibiotic or if not improving

## 2019-07-06 ENCOUNTER — Ambulatory Visit: Payer: 59 | Admitting: Internal Medicine

## 2019-07-18 ENCOUNTER — Other Ambulatory Visit: Payer: Self-pay

## 2019-07-18 ENCOUNTER — Ambulatory Visit (INDEPENDENT_AMBULATORY_CARE_PROVIDER_SITE_OTHER): Payer: 59 | Admitting: Internal Medicine

## 2019-07-18 ENCOUNTER — Encounter: Payer: Self-pay | Admitting: Internal Medicine

## 2019-07-18 VITALS — BP 120/68 | HR 79 | Temp 98.0°F | Ht 63.5 in | Wt 177.0 lb

## 2019-07-18 DIAGNOSIS — L0293 Carbuncle, unspecified: Secondary | ICD-10-CM | POA: Diagnosis not present

## 2019-07-18 DIAGNOSIS — Z0000 Encounter for general adult medical examination without abnormal findings: Secondary | ICD-10-CM | POA: Diagnosis not present

## 2019-07-18 NOTE — Progress Notes (Signed)
This visit occurred during the SARS-CoV-2 public health emergency.  Safety protocols were in place, including screening questions prior to the visit, additional usage of staff PPE, and extensive cleaning of exam room while observing appropriate contact time as indicated for disinfecting solutions.    Chief Complaint  Patient presents with  . Annual Exam    Doing good    HPI: Patient  Christine Harris  34 y.o. comes in today for Preventive Health Care visit   sinc last visit  Recurrent boilds  Seen  April 30  Given antibiotic   Is all better and healed   physician for women.  To have gyne check has Mirena IUD    Has 33 yo at home in day care .   Health Maintenance  Topic Date Due  . COVID-19 Vaccine (1) Never done  . INFLUENZA VACCINE  10/02/2019  . PAP SMEAR-Modifier  11/22/2021  . TETANUS/TDAP  12/26/2026  . 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    ROS:  Can get sonds of shooting post head pain if cough or laugh  No headache or neuro sx  GEN/ HEENT: No fever, significant weight changes sweats headaches vision problems hearing changes, CV/ PULM; No chest pain shortness of breath cough, syncope,edema  change in exercise tolerance. GI /GU: No adominal pain, vomiting, change in bowel habits. No blood in the stool. No significant GU symptoms. SKIN/HEME: ,no acute skin rashes suspicious lesions or bleeding. No lymphadenopathy, nodules, masses.  NEURO/ PSYCH:  No neurologic signs such as weakness numbness. No depression anxiety. IMM/ Allergy: No unusual infections.  Allergy .   REST of 12 system review negative except as per HPI   Past Medical History:  Diagnosis Date  . Abscess    surgery consult 2003  . Bacterial vaginosis  12/05/2010  . Delayed menses 03/23/2013   6 week tab dec 5th .    [name] Depo-Provera contraceptive status 01/05/2018  . H/O varicella   . H/O: varicose veins   . Medical history non-contributory   . Yeast infection     Past Surgical History:  Procedure Laterality Date  . HERNIA REPAIR  age 30  . TONSILLECTOMY  2010    Family History  Problem Relation Age of Onset  . Diabetes Maternal Grandmother   . Colon cancer Maternal Grandmother   . Hyperlipidemia Maternal Grandmother   . Heart disease Maternal Grandmother   . Cancer Maternal Grandmother   . Diabetes Maternal Grandfather   . Asthma Paternal Grandmother   . Heart disease Paternal Grandmother   . Hyperlipidemia Paternal Grandmother   . Stroke Paternal Grandmother   . Hypertension Mother   . Hypertension Father     Social History   Socioeconomic History  . Marital status: Single    Spouse name: Not on file  . Number of children: Not on file  . Years of education: Not on file  . Highest education level: Not on file  Occupational History  . Not on file  Tobacco Use  . Smoking status: Never Smoker  . Smokeless tobacco: Never Used  Substance and Sexual Activity  . Alcohol use: Yes  Alcohol/week: 0.0 standard drinks  . Drug use: No  . Sexual activity: Yes    Birth control/protection: None  Other Topics Concern  . 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 Weenie dog.    2 cokes per week.    Social Determinants of Health   Financial Resource Strain:   . Difficulty of Paying Living Expenses:   Food Insecurity:   . Worried About Programme researcher, broadcasting/film/video in the Last Year:   . Barista in the Last Year:   Transportation Needs:   . Freight forwarder (Medical):   [name] Lack of Transportation (Non-Medical):   Physical Activity:   . Days of Exercise per Week:   . Minutes of Exercise per Session:     Stress:   . Feeling of Stress :   Social Connections:   . Frequency of Communication with Friends and Family:   . Frequency of Social Gatherings with Friends and Family:   . Attends Religious Services:   . Active Member of Clubs or Organizations:   . Attends Banker Meetings:   [name] Marital Status:     Outpatient Medications Prior to Visit  Medication Sig Dispense Refill  . clindamycin (CLINDAGEL) 1 % gel Apply topically 2 (two) times daily. (Patient taking differently: Apply topically as needed. ) 30 g 1  . ibuprofen (ADVIL,MOTRIN) 600 MG tablet Take 1 tablet (600 mg total) by mouth every 6 (six) hours. 30 tablet 0  . Prenatal Vit-Fe Fumarate-FA (MULTIVITAMIN-PRENATAL) 27-0.8 MG TABS tablet Take 1 tablet by mouth daily at 12 noon.    . Probiotic Product (CULTURELLE PROBIOTICS PO) Take 1 capsule by mouth daily.    . clindamycin (CLEOCIN) 300 MG capsule Take 1 capsule (300 mg total) by mouth 3 (three) times daily. (Patient not taking: Reported on 07/18/2019) 21 capsule 0   No facility-administered medications prior to visit.     EXAM:  BP 120/68   Pulse 79   Temp 98 F (36.7 C) (Temporal)   Ht 5' 3.5" (1.613 m)   Wt 177 lb (80.3 kg)   BMI 30.86 kg/m   Body mass index is 30.86 kg/m. Wt Readings from Last 3 Encounters:  07/18/19 177 lb (80.3 kg)  07/01/19 183 lb 3.2 oz (83.1 kg)  12/21/18 178 lb (80.7 kg)    Physical Exam: Vital signs reviewed PYP:PJKD is a well-developed well-nourished alert cooperative    who appearsr stated age in no acute distress.  HEENT: normocephalic atraumatic , Eyes: PERRL EOM's full, conjunctiva clear, Nares: paten,t no deformity discharge or tenderness., Ears: no deformity EAC's clear TMs with normal landmarks. Mouth: masked  NECK: supple without masses,  or bruitsthyroid palpable no nodules  CHEST/PULM:  Clear to auscultation and percussion breath sounds equal no wheeze , rales or rhonchi. No chest wall deformities or  tenderness. Breast: deferred  To gyne. CV: PMI is nondisplaced, S1 S2 no gallops, murmurs, rubs. Peripheral pulses are full without delay.No JVD .  ABDOMEN: Bowel sounds normal nontender  No guard or rebound, no hepato splenomegal no CVA tenderness.  . Extremtities:  No clubbing cyanosis or edema, no acute joint swelling or redness no focal atrophy NEURO:  Oriented x3, cranial nerves 3-12 appear to be intact, no obvious focal weakness,gait within normal limits  no abnormal reflexes or asymmetrical SKIN: No acute rashes normal turgor, color, no bruising or petechiae. Tats  PSYCH: Oriented, good eye contact, no obvious depression anxiety, cognition and judgment appear normal. LN: no cervical axillary inguinal adenopathy  Lab Results  Component Value Date   WBC 5.8 02/28/2017   HGB 11.3 (L) 02/28/2017   HCT 33.1 (L) 02/28/2017   PLT 247 02/28/2017   GLUCOSE 87 06/07/2013   CHOL 147 06/07/2013   TRIG 49.0 06/07/2013   HDL 54.30 06/07/2013   LDLCALC 83 06/07/2013   ALT 11 06/07/2013   AST 15 06/07/2013   NA 138 06/07/2013   K 3.7 06/07/2013   CL 103 06/07/2013   CREATININE 0.87 03/01/2017   BUN 16 06/07/2013   CO2 29 06/07/2013   TSH 0.94 06/07/2013    BP Readings from Last 3 Encounters:  07/18/19 120/68  07/01/19 122/76  12/21/18 112/72    Lab plan w reviewed with patient  Will com back for fasting blood work   ASSESSMENT AND PLAN:  Discussed the following assessment and plan:    ICD-10-CM   1. Visit for preventive health examination  Z00.00 Basic metabolic panel    CBC with Differential/Platelet    Hemoglobin A1c    Hepatic function panel    Lipid panel    TSH  2. Recurrent boils  L02.93 Basic metabolic panel    CBC with Differential/Platelet    Hemoglobin A1c    Hepatic function panel    Lipid panel    TSH   hx staph pan sensitive better today resolved    Return for depending on results or 1 year cpx as needed . Counseled. lsi healthy  Weight sleep  declining  covid vaccine at this time   Disc risk and benefits  Patient Care Team: Preeya Cleckley, Neta MendsWanda K, MD as PCP - General Haygood, Maris BergerVanessa P, MD (Inactive) (Obstetrics and Gynecology) Patient Instructions  Glad you are feeling better  Can use topical antibiotic as needed .  Get  appt for fasting lab.   Let us know if the head pain worsens persists or concerns but seems like  Pinched nerve .      Health Maintenance, Female Adopting a healthy lifestyle and getting preventive care are important in promoting health and wellness. Ask your health care provider about:  The right schedule for you to have regular tests and exams.  Things you can do on your own to prevent diseases and keep yourself healthy. What should I know about diet, weight, and exercise? Eat a healthy diet   Eat a diet that includes plenty of vegetables, fruits, low-fat dairy products, and lean protein.  Do not eat a lot of foods that are high in solid fats, added sugars, or sodium. Maintain a healthy weight Body mass index (BMI) is used to identify weight problems. It estimates body fat based on height and weight. Your health care provider can help determine your BMI and help you achieve or maintain a healthy weight. Get regular exercise Get regular exercise. This is one of the most important things you can do for your health. Most adults should:  Exercise for at least 150 minutes each week. The exercise should increase your heart rate and make you sweat (moderate-intensity exercise).  Do strengthening exercises at least twice a week. This is in addition to the moderate-intensity exercise.  Spend less time sitting. Even light physical activity can be beneficial. Watch cholesterol and blood lipids Have your blood tested for lipids and  cholesterol at 33 years of age, then have this test every 5 years. Have your cholesterol levels checked more often if:  Your lipid or cholesterol levels are high.  You are older than 33 years  of age.  You are at high risk for heart disease. What should I know about cancer screening? Depending on your health history and family history, you may need to have cancer screening at various ages. This may include screening for:  Breast cancer.  Cervical cancer.  Colorectal cancer.  Skin cancer.  Lung cancer. What should I know about heart disease, diabetes, and high blood pressure? Blood pressure and heart disease  High blood pressure causes heart disease and increases the risk of stroke. This is more likely to develop in people who have high blood pressure readings, are of African descent, or are overweight.  Have your blood pressure checked: ? Every 3-5 years if you are 96-39 years of age. ? Every year if you are 24 years old or older. Diabetes Have regular diabetes screenings. This checks your fasting blood sugar level. Have the screening done:  Once every three years after age 70 if you are at a normal weight and have a low risk for diabetes.  More often and at a younger age if you are overweight or have a high risk for diabetes. What should I know about preventing infection? Hepatitis B If you have a higher risk for hepatitis B, you should be screened for this virus. Talk with your health care provider to find out if you are at risk for hepatitis B infection. Hepatitis C Testing is recommended for:  Everyone born from [birth year] through 1964.  Anyone with known risk factors for hepatitis C. Sexually transmitted infections (STIs)  Get screened for STIs, including gonorrhea and chlamydia, if: ? You are sexually active and are younger than 33 years of age. ? You are older than 33 years of age and your health care provider tells you that you are at risk for this type of infection. ? Your sexual activity has changed since you were last screened, and you are at increased risk for chlamydia or gonorrhea. Ask your health care provider if you are at risk.  Ask your health care  provider about whether you are at high risk for HIV. Your health care provider may recommend a prescription medicine to help prevent HIV infection. If you choose to take medicine to prevent HIV, you should first get tested for HIV. You should then be tested every 3 months for as long as you are taking the medicine. Pregnancy  If you are about to stop having your period (premenopausal) and you may become pregnant, seek counseling before you get pregnant.  Take 400 to 800 micrograms (mcg) of folic acid every day if you become pregnant.  Ask for birth control (contraception) if you want to prevent pregnancy. Osteoporosis and menopause Osteoporosis is a disease in which the bones lose minerals and strength with aging. This can result in bone fractures. If you are 44 years old or older, or if you are at risk for osteoporosis and fractures, ask your health care provider if you should:  Be screened for bone loss.  Take a calcium or vitamin D supplement to lower your risk of fractures.  Be given hormone replacement therapy (HRT) to treat symptoms of menopause. Follow these instructions at home: Lifestyle  Do not use any products that contain nicotine or tobacco, such as cigarettes, e-cigarettes, and chewing tobacco. If you need  help quitting, ask your health care provider.  Do not use street drugs.  Do not share needles.  Ask your health care provider for help if you need support or information about quitting drugs. Alcohol use  Do not drink alcohol if: ? Your health care provider tells you not to drink. ? You are pregnant, may be pregnant, or are planning to become pregnant.  If you drink alcohol: ? Limit how much you use to 0-1 drink a day. ? Limit intake if you are breastfeeding.  Be aware of how much alcohol is in your drink. In the [place] one drink equals one 12 oz bottle of beer (355 mL), one 5 oz glass of wine (148 mL), or one 1 oz glass of hard liquor (44 mL). General  instructions  Schedule regular health, dental, and eye exams.  Stay current with your vaccines.  Tell your health care provider if: ? You often feel depressed. ? You have ever been abused or do not feel safe at home. Summary  Adopting a healthy lifestyle and getting preventive care are important in promoting health and wellness.  Follow your health care provider's instructions about healthy diet, exercising, and getting tested or screened for diseases.  Follow your health care provider's instructions on monitoring your cholesterol and blood pressure. This information is not intended to replace advice given to you by your health care provider. Make sure you discuss any questions you have with your health care provider. Document Revised: 02/10/2018 Document Reviewed: 02/10/2018 Elsevier Patient Education  2020 [employer].    [place] K. Preslea Rhodus M.D.

## 2019-07-18 NOTE — Patient Instructions (Addendum)
Glad you are feeling better  Can use topical antibiotic as needed .  Get  appt for fasting lab.   Let us know if the head pain worsens persists or concerns but seems like  Pinched nerve .      Health Maintenance, Female Adopting a healthy lifestyle and getting preventive care are important in promoting health and wellness. Ask your health care provider about:  The right schedule for you to have regular tests and exams.  Things you can do on your own to prevent diseases and keep yourself healthy. What should I know about diet, weight, and exercise? Eat a healthy diet   Eat a diet that includes plenty of vegetables, fruits, low-fat dairy products, and lean protein.  Do not eat a lot of foods that are high in solid fats, added sugars, or sodium. Maintain a healthy weight Body mass index (BMI) is used to identify weight problems. It estimates body fat based on height and weight. Your health care provider can help determine your BMI and help you achieve or maintain a healthy weight. Get regular exercise Get regular exercise. This is one of the most important things you can do for your health. Most adults should:  Exercise for at least 150 minutes each week. The exercise should increase your heart rate and make you sweat (moderate-intensity exercise).  Do strengthening exercises at least twice a week. This is in addition to the moderate-intensity exercise.  Spend less time sitting. Even light physical activity can be beneficial. Watch cholesterol and blood lipids Have your blood tested for lipids and cholesterol at 33 years of age, then have this test every 5 years. Have your cholesterol levels checked more often if:  Your lipid or cholesterol levels are high.  You are older than 33 years of age.  You are at high risk for heart disease. What should I know about cancer screening? Depending on your health history and family history, you may need to have cancer screening at various  ages. This may include screening for:  Breast cancer.  Cervical cancer.  Colorectal cancer.  Skin cancer.  Lung cancer. What should I know about heart disease, diabetes, and high blood pressure? Blood pressure and heart disease  High blood pressure causes heart disease and increases the risk of stroke. This is more likely to develop in people who have high blood pressure readings, are of African descent, or are overweight.  Have your blood pressure checked: ? Every 3-5 years if you are 41-32 years of age. ? Every year if you are 80 years old or older. Diabetes Have regular diabetes screenings. This checks your fasting blood sugar level. Have the screening done:  Once every three years after age 68 if you are at a normal weight and have a low risk for diabetes.  More often and at a younger age if you are overweight or have a high risk for diabetes. What should I know about preventing infection? Hepatitis B If you have a higher risk for hepatitis B, you should be screened for this virus. Talk with your health care provider to find out if you are at risk for hepatitis B infection. Hepatitis C Testing is recommended for:  Everyone born from [birth year] through 1964.  Anyone with known risk factors for hepatitis C. Sexually transmitted infections (STIs)  Get screened for STIs, including gonorrhea and chlamydia, if: ? You are sexually active and are younger than 33 years of age. ? You are older than 33 years of  age and your health care provider tells you that you are at risk for this type of infection. ? Your sexual activity has changed since you were last screened, and you are at increased risk for chlamydia or gonorrhea. Ask your health care provider if you are at risk.  Ask your health care provider about whether you are at high risk for HIV. Your health care provider may recommend a prescription medicine to help prevent HIV infection. If you choose to take medicine to prevent HIV, you  should first get tested for HIV. You should then be tested every 3 months for as long as you are taking the medicine. Pregnancy  If you are about to stop having your period (premenopausal) and you may become pregnant, seek counseling before you get pregnant.  Take 400 to 800 micrograms (mcg) of folic acid every day if you become pregnant.  Ask for birth control (contraception) if you want to prevent pregnancy. Osteoporosis and menopause Osteoporosis is a disease in which the bones lose minerals and strength with aging. This can result in bone fractures. If you are 58 years old or older, or if you are at risk for osteoporosis and fractures, ask your health care provider if you should:  Be screened for bone loss.  Take a calcium or vitamin D supplement to lower your risk of fractures.  Be given hormone replacement therapy (HRT) to treat symptoms of menopause. Follow these instructions at home: Lifestyle  Do not use any products that contain nicotine or tobacco, such as cigarettes, e-cigarettes, and chewing tobacco. If you need help quitting, ask your health care provider.  Do not use street drugs.  Do not share needles.  Ask your health care provider for help if you need support or information about quitting drugs. Alcohol use  Do not drink alcohol if: ? Your health care provider tells you not to drink. ? You are pregnant, may be pregnant, or are planning to become pregnant.  If you drink alcohol: ? Limit how much you use to 0-1 drink a day. ? Limit intake if you are breastfeeding.  Be aware of how much alcohol is in your drink. In the [place] one drink equals one 12 oz bottle of beer (355 mL), one 5 oz glass of wine (148 mL), or one 1 oz glass of hard liquor (44 mL). General instructions  Schedule regular health, dental, and eye exams.  Stay current with your vaccines.  Tell your health care provider if: ? You often feel depressed. ? You have ever been abused or do not feel  safe at home. Summary  Adopting a healthy lifestyle and getting preventive care are important in promoting health and wellness.  Follow your health care provider's instructions about healthy diet, exercising, and getting tested or screened for diseases.  Follow your health care provider's instructions on monitoring your cholesterol and blood pressure. This information is not intended to replace advice given to you by your health care provider. Make sure you discuss any questions you have with your health care provider. Document Revised: 02/10/2018 Document Reviewed: 02/10/2018 Elsevier Patient Education  2020 [employer].

## 2019-07-20 ENCOUNTER — Other Ambulatory Visit: Payer: Self-pay

## 2019-07-21 ENCOUNTER — Other Ambulatory Visit (INDEPENDENT_AMBULATORY_CARE_PROVIDER_SITE_OTHER): Payer: 59

## 2019-07-21 DIAGNOSIS — Z0000 Encounter for general adult medical examination without abnormal findings: Secondary | ICD-10-CM | POA: Diagnosis not present

## 2019-07-21 DIAGNOSIS — L0293 Carbuncle, unspecified: Secondary | ICD-10-CM | POA: Diagnosis not present

## 2019-07-21 LAB — BASIC METABOLIC PANEL
BUN: 15 mg/dL (ref 6–23)
CO2: 31 mEq/L (ref 19–32)
Calcium: 8.9 mg/dL (ref 8.4–10.5)
Chloride: 106 mEq/L (ref 96–112)
Creatinine, Ser: 0.72 mg/dL (ref 0.40–1.20)
GFR: 113.15 mL/min (ref >60.00)
Glucose, Bld: 100 mg/dL — ABNORMAL HIGH (ref 70–99)
Potassium: 4 mEq/L (ref 3.5–5.1)
Sodium: 139 mEq/L (ref 135–145)

## 2019-07-21 LAB — CBC WITH DIFFERENTIAL/PLATELET
Basophils Absolute: 0 10*3/uL (ref 0.0–0.1)
Basophils Relative: 0.4 % (ref 0.0–3.0)
Eosinophils Absolute: 0.1 10*3/uL (ref 0.0–0.7)
Eosinophils Relative: 1.6 % (ref 0.0–5.0)
HCT: 36.2 % (ref 36.0–46.0)
Hemoglobin: 12.3 g/dL (ref 12.0–15.0)
Lymphocytes Relative: 33.2 % (ref 12.0–46.0)
Lymphs Abs: 1.5 10*3/uL (ref 0.7–4.0)
MCHC: 33.9 g/dL (ref 30.0–36.0)
MCV: 92 fl (ref 78.0–100.0)
Monocytes Absolute: 0.4 10*3/uL (ref 0.1–1.0)
Monocytes Relative: 7.8 % (ref 3.0–12.0)
Neutro Abs: 2.6 10*3/uL (ref 1.4–7.7)
Neutrophils Relative %: 57 % (ref 43.0–77.0)
Platelets: 286 10*3/uL (ref 150.0–400.0)
RBC: 3.94 Mil/uL (ref 3.87–5.11)
RDW: 12.7 % (ref 11.5–15.5)
WBC: 4.5 10*3/uL (ref 4.0–10.5)

## 2019-07-21 LAB — HEPATIC FUNCTION PANEL
ALT: 13 U/L (ref 0–35)
AST: 13 U/L (ref 0–37)
Albumin: 4.1 g/dL (ref 3.5–5.2)
Alkaline Phosphatase: 41 U/L (ref 39–117)
Bilirubin, Direct: 0.1 mg/dL (ref 0.0–0.3)
Total Bilirubin: 0.2 mg/dL (ref 0.2–1.2)
Total Protein: 6.6 g/dL (ref 6.0–8.3)

## 2019-07-21 LAB — LIPID PANEL
Cholesterol: 117 mg/dL (ref 0–200)
HDL: 40.2 mg/dL (ref >39.00)
LDL Cholesterol: 69 mg/dL (ref 0–99)
NonHDL: 76.64
Total CHOL/HDL Ratio: 3
Triglycerides: 40 mg/dL (ref 0.0–149.0)
VLDL: 8 mg/dL (ref 0.0–40.0)

## 2019-07-21 LAB — TSH: TSH: 1.39 u[IU]/mL (ref 0.35–4.50)

## 2019-07-21 LAB — HEMOGLOBIN A1C: Hgb A1c MFr Bld: 5.6 % (ref 4.6–6.5)

## 2019-07-21 NOTE — Progress Notes (Signed)
Blood work normal  blood sugar  one point borderline but  no diabetes .  Continue  attention to lifestyle intervention healthy eating and activity

## 2020-03-28 ENCOUNTER — Ambulatory Visit (INDEPENDENT_AMBULATORY_CARE_PROVIDER_SITE_OTHER): Payer: 59 | Admitting: Certified Nurse Midwife

## 2020-03-28 ENCOUNTER — Other Ambulatory Visit: Payer: Self-pay

## 2020-03-28 ENCOUNTER — Encounter: Payer: Self-pay | Admitting: Certified Nurse Midwife

## 2020-03-28 ENCOUNTER — Other Ambulatory Visit (HOSPITAL_COMMUNITY)
Admission: RE | Admit: 2020-03-28 | Discharge: 2020-03-28 | Disposition: A | Discharge status: Home / Self Care | Payer: 59 | Source: Ambulatory Visit | Attending: Women's Health | Admitting: Women's Health

## 2020-03-28 VITALS — BP 110/72 | HR 66 | Wt 171.0 lb

## 2020-03-28 DIAGNOSIS — T8332XA Displacement of intrauterine contraceptive device, initial encounter: Secondary | ICD-10-CM | POA: Diagnosis not present

## 2020-03-28 DIAGNOSIS — N6489 Other specified disorders of breast: Secondary | ICD-10-CM

## 2020-03-28 DIAGNOSIS — Z01419 Encounter for gynecological examination (general) (routine) without abnormal findings: Secondary | ICD-10-CM

## 2020-03-28 DIAGNOSIS — Z113 Encounter for screening for infections with a predominantly sexual mode of transmission: Secondary | ICD-10-CM

## 2020-03-28 NOTE — Patient Instructions (Addendum)
For recurrent BV: Start daily probiotic with acidophilus and lactobacillus     HPV (Human Papillomavirus) Vaccine: What You Need to Know 1. Why get vaccinated? HPV (human papillomavirus) vaccine can prevent infection with some types of human papillomavirus. HPV infections can cause certain types of cancers, including:  cervical, vaginal, and vulvar cancers in women  penile cancer in men  anal cancers in both men and women  cancers of tonsils, base of tongue, and back of throat (oropharyngeal cancer) in both men and women HPV infections can also cause anogenital warts. HPV vaccine can prevent over 90% of cancers caused by HPV. HPV is spread through intimate skin-to-skin or sexual contact. HPV infections are so common that nearly all people will get at least one type of HPV at some time in their lives. Most HPV infections go away on their own within 2 years. But sometimes HPV infections will last longer and can cause cancers later in life. 2. HPV vaccine HPV vaccine is routinely recommended for adolescents at 57 or 34 years of age to ensure they are protected before they are exposed to the virus. HPV vaccine may be given beginning at age 12 years and vaccination is recommended for everyone through 34 years of age. HPV vaccine may be given to adults 27 through 34 years of age, based on discussions between the patient and health care provider. Most children who get the first dose before 63 years of age need 2 doses of HPV vaccine. People who get the first dose at or after 94 years of age and younger people with certain immunocompromising conditions need 3 doses. Your health care provider can give you more information. HPV vaccine may be given at the same time as other vaccines. 3. Talk with your health care provider Tell your vaccination provider if the person getting the vaccine:  Has had an allergic reaction after a previous dose of HPV vaccine, or has any severe, life-threatening  allergies  Is pregnant-HPV vaccine is not recommended until after pregnancy In some cases, your health care provider may decide to postpone HPV vaccination until a future visit. People with minor illnesses, such as a cold, may be vaccinated. People who are moderately or severely ill should usually wait until they recover before getting HPV vaccine. Your health care provider can give you more information. 4. Risks of a vaccine reaction  Soreness, redness, or swelling where the shot is given can happen after HPV vaccination.  Fever or headache can happen after HPV vaccination. People sometimes faint after medical procedures, including vaccination. Tell your provider if you feel dizzy or have vision changes or ringing in the ears. As with any medicine, there is a very remote chance of a vaccine causing a severe allergic reaction, other serious injury, or death. 5. What if there is a serious problem? An allergic reaction could occur after the vaccinated person leaves the clinic. If you see signs of a severe allergic reaction (hives, swelling of the face and throat, difficulty breathing, a fast heartbeat, dizziness, or weakness), call 9-1-1 and get the person to the nearest hospital. For other signs that concern you, call your health care provider. Adverse reactions should be reported to the Vaccine Adverse Event Reporting System (VAERS). Your health care provider will usually file this report, or you can do it yourself. Visit the VAERS website at www.vaers.LAgents.no or call 914-253-5572. VAERS is only for reporting reactions, and VAERS staff members do not give medical advice. 6. The National Vaccine Injury [employer]  The [employer] Injury Compensation Program (VICP) is a federal program that was created to compensate people who may have been injured by certain vaccines. Claims regarding alleged injury or death due to vaccination have a time limit for filing, which may be as short as  two years. Visit the VICP website at SpiritualWord.at or call 959-434-1523 to learn about the program and about filing a claim. 7. How can I learn more?  Ask your health care provider.  Call your local or state health department.  Visit the website of the Food and Drug Administration (FDA) for vaccine package inserts and additional information at FinderList.no.  Contact the Centers for Disease Control and Prevention (CDC): ? Call 513 528 3704 (1-800-CDC-INFO) or ? Visit CDC's website at PicCapture.uy. Vaccine Information Statement HPV Vaccine (10/07/2019) This information is not intended to replace advice given to you by your health care provider. Make sure you discuss any questions you have with your health care provider. Document Revised: 11/15/2019 Document Reviewed: 11/15/2019 Elsevier Patient Education  2021 [employer].

## 2020-03-28 NOTE — Progress Notes (Signed)
Gynecology Annual Exam   History of Present Illness: Christine Harris is a 35 y.o. single female presenting for an annual exam. She is concerned about having a recent menstrual cycles as she has been amenorrheic with her IUD. She is sexually active. No new partner. She admits to dyspareunia with certain positions but this is not a new sx. She does perform self breast exams. She is concerned that her left breast is significantly larger than the right. First noticed this after having her child 3 years ago. States is getting larger. No significant weight changes. Denies lumps, bumps, skin changes, or nipple discharge. There is no notable family history of breast or ovarian cancer in her family. She is requesting STD screening.  Past Medical History:  Past Medical History:  Diagnosis Date  . Abscess    surgery consult 2003  . Bacterial vaginosis 12/05/2010  . Delayed menses 03/23/2013   6 week tab dec 5th .    [name] Depo-Provera contraceptive status 01/05/2018  . H/O varicella   . H/O: varicose veins   . Medical history non-contributory   . Yeast infection     Past Surgical History:  Past Surgical History:  Procedure Laterality Date  . HERNIA REPAIR  age 56  . TONSILLECTOMY  2010    Gynecologic History:  LMP: Patient's last menstrual period was 03/14/2020. Average Interval: rare  Heavy Menses: no Clots: no Intermenstrual Bleeding: no Postcoital Bleeding: no Dysmenorrhea: no Contraception: IUD Last Pap: completed on 11/21/18 ; result was: normal but absent trans zone  Mammogram: n/a  Obstetric History: [id number]  Family History:  Family History  Problem Relation Age of Onset  . Diabetes Maternal Grandmother   . Colon cancer Maternal Grandmother   . Hyperlipidemia Maternal Grandmother   . Heart disease Maternal Grandmother   . Cancer Maternal Grandmother   . Diabetes Maternal Grandfather   . Asthma Paternal Grandmother   . Heart disease Paternal Grandmother   . Hyperlipidemia  Paternal Grandmother   . Stroke Paternal Grandmother   . Hypertension Mother   . Hypertension Father     Social History:  Social History   Socioeconomic History  . Marital status: Single    Spouse name: Not on file  . Number of children: Not on file  . Years of education: Not on file  . Highest education level: Not on file  Occupational History  . Not on file  Tobacco Use  . Smoking status: Never Smoker  . Smokeless tobacco: Never Used  Vaping Use  . Vaping Use: Never used  Substance and Sexual Activity  . Alcohol use: Yes    Alcohol/week: 0.0 standard drinks  . Drug use: No  . Sexual activity: Yes    Birth control/protection: None  Other Topics Concern  . 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 Weenie dog.    2 cokes per week.    Social Determinants of Health   Financial Resource Strain: Not on file  Food Insecurity: Not on file  Transportation Needs: Not on file  Physical Activity: Not on file  Stress: Not on file  Social Connections: Not on file  Intimate Partner Violence: Not on file    Allergies:  Allergies  Allergen Reactions  . Codeine Itching    Medications: Prior to Admission  medications   Medication Sig Start Date End Date Taking? Authorizing Provider  clindamycin (CLINDAGEL) 1 % gel Apply topically 2 (two) times daily. Patient taking differently: Apply topically as needed.  07/01/19   Panosh, Neta Mends, MD  ibuprofen (ADVIL,MOTRIN) 600 MG tablet Take 1 tablet (600 mg total) by mouth every 6 (six) hours. 03/03/17   Moss, Amber, DO  Prenatal Vit-Fe Fumarate-FA (MULTIVITAMIN-PRENATAL) 27-0.8 MG TABS tablet Take 1 tablet by mouth daily at 12 noon.    [provider]  Probiotic Product (CULTURELLE PROBIOTICS PO) Take 1 capsule by mouth daily.    [provider]    Review of Systems: negative except noted in HPI  Physical  Exam Vitals: BP 110/72   Pulse 66   Wt 171 lb (77.6 kg)   LMP 03/14/2020   BMI 29.82 kg/m  General: NAD HEENT: normocephalic, atraumatic Thyroid: no enlargement, no palpable nodules Pulmonary: Normal rate and effort, CTAB Cardiovascular: RRR Breast: Breast asymmetrical, no tenderness, no palpable nodules or masses, no skin or nipple; left breast moderately more enlarged than right. No retraction present, no nipple discharge. No axillary or supraclavicular lymphadenopathy. Abdomen: soft, non-tender, non-distended. No hepatomegaly, splenomegaly or masses palpable. No evidence of hernia  Genitourinary:  External: Normal external female genitalia. Normal urethral meatus  Vagina: Normal vaginal mucosa, no evidence of prolapse   Cervix: Grossly normal in appearance, no bleeding; string not seen, unable to tease out from cervical canal  Uterus: Non-enlarged, mobile, normal contour. No CMT  Adnexa: non-enlarged, no masses  Rectal: deferred Extremities: no edema, erythema, or tenderness Neurologic: Grossly intact Psychiatric: mood appropriate, affect full  Female chaperone present for pelvic and breast portions of the physical exam  Assessment:  1. Well woman exam   2. Screen for STD (sexually transmitted disease)   3. Intrauterine contraceptive device threads lost, initial encounter   4. Breast asymmetry    Plan: Breast US- low suspicion for abnormality Pelvic US- recommend abstinence or condoms until IUD placement confirmed Follow up with GYN in 1 year or prn Follow up with PCP prn  Donette Larry, CNM 03/28/2020 1:53 PM

## 2020-03-29 ENCOUNTER — Telehealth: Payer: Self-pay | Admitting: Internal Medicine

## 2020-03-29 LAB — CERVICOVAGINAL ANCILLARY ONLY
Bacterial Vaginitis (gardnerella): POSITIVE — AB
Candida Glabrata: NEGATIVE
Candida Vaginitis: NEGATIVE
Chlamydia: NEGATIVE
Comment: NEGATIVE
Comment: NEGATIVE
Comment: NEGATIVE
Comment: NEGATIVE
Comment: NEGATIVE
Comment: NORMAL
Neisseria Gonorrhea: NEGATIVE
Trichomonas: NEGATIVE

## 2020-03-29 LAB — RPR+HBSAG+HCVAB+...
HIV Screen 4th Generation wRfx: NONREACTIVE
Hep C Virus Ab: 0.2 s/co ratio (ref 0.0–0.9)
Hepatitis B Surface Ag: NEGATIVE
RPR Ser Ql: NONREACTIVE

## 2020-03-29 NOTE — Telephone Encounter (Signed)
Pt states she has had the covid vaccine but not the booster

## 2020-03-30 ENCOUNTER — Other Ambulatory Visit: Payer: Self-pay | Admitting: Certified Nurse Midwife

## 2020-03-30 DIAGNOSIS — B9689 Other specified bacterial agents as the cause of diseases classified elsewhere: Secondary | ICD-10-CM

## 2020-03-30 MED ORDER — METRONIDAZOLE 500 MG PO TABS: 500.0000 mg | ORAL_TABLET | Freq: Two times a day (BID) | ORAL | 0 refills | Status: DC

## 2020-04-02 ENCOUNTER — Ambulatory Visit
Admission: RE | Admit: 2020-04-02 | Discharge: 2020-04-02 | Disposition: A | Discharge status: Home / Self Care | Payer: 59 | Source: Ambulatory Visit | Attending: Certified Nurse Midwife | Admitting: Certified Nurse Midwife

## 2020-04-02 ENCOUNTER — Other Ambulatory Visit: Payer: Self-pay | Admitting: Certified Nurse Midwife

## 2020-04-02 ENCOUNTER — Other Ambulatory Visit: Payer: Self-pay

## 2020-04-02 DIAGNOSIS — N6489 Other specified disorders of breast: Secondary | ICD-10-CM

## 2020-04-10 ENCOUNTER — Ambulatory Visit
Admission: RE | Admit: 2020-04-10 | Discharge: 2020-04-10 | Disposition: A | Discharge status: Home / Self Care | Payer: 59 | Source: Ambulatory Visit | Attending: Certified Nurse Midwife | Admitting: Certified Nurse Midwife

## 2020-04-10 DIAGNOSIS — T8332XA Displacement of intrauterine contraceptive device, initial encounter: Secondary | ICD-10-CM

## 2022-07-03 IMAGING — US US PELVIS COMPLETE WITH TRANSVAGINAL
1 series · 13 of 25 positions shown · non-contrast
Comparison: None available.

CLINICAL DATA: Initial evaluation for lost IUD string.



[Series 1: us pelvis complete with transvaginal · 0.22mm/px · 13 of 58 slices shown]
[im 1/58]
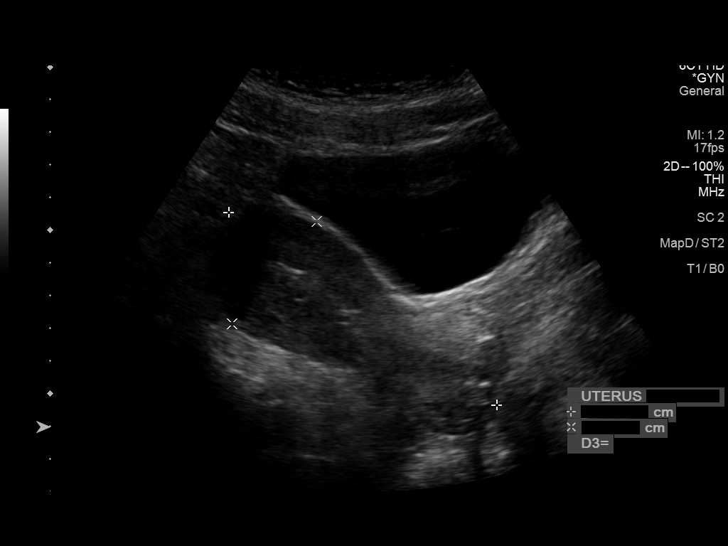
[im 5/58]
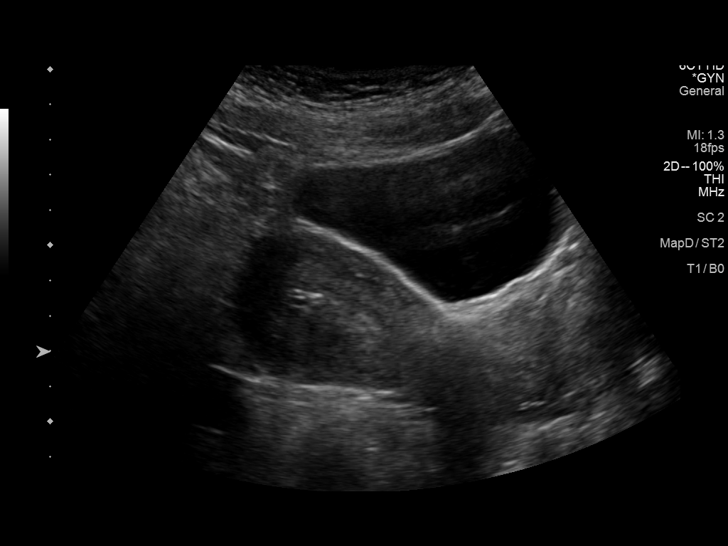
[im 10/58]
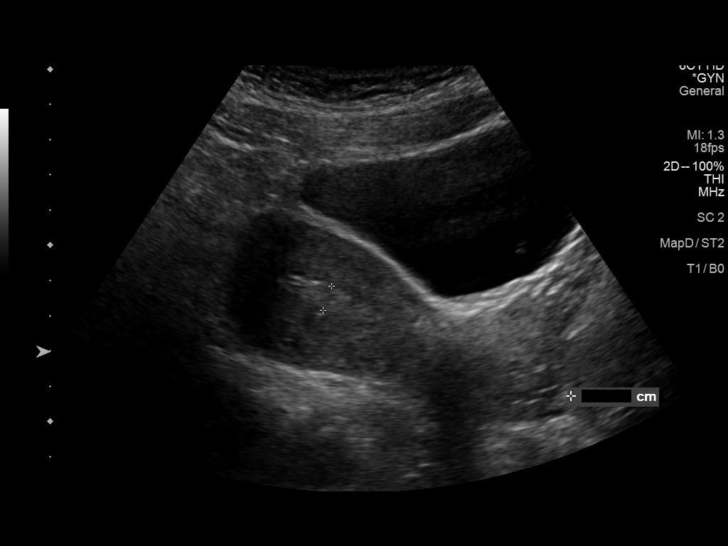
[im 15/58]
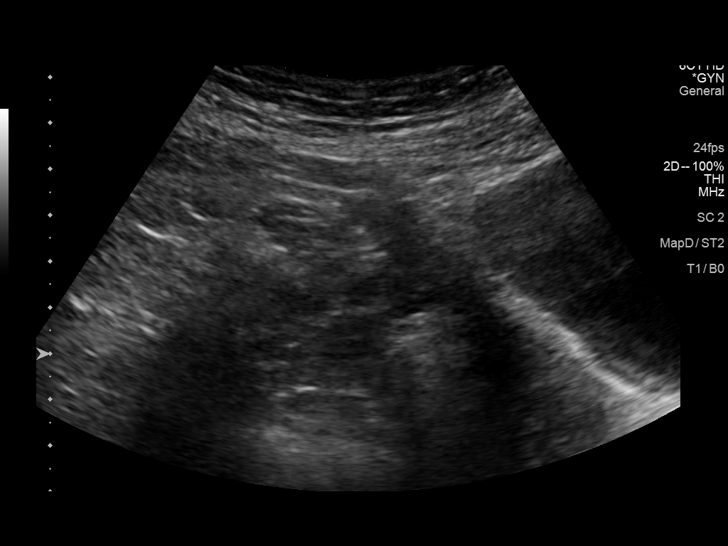
[im 20/58]
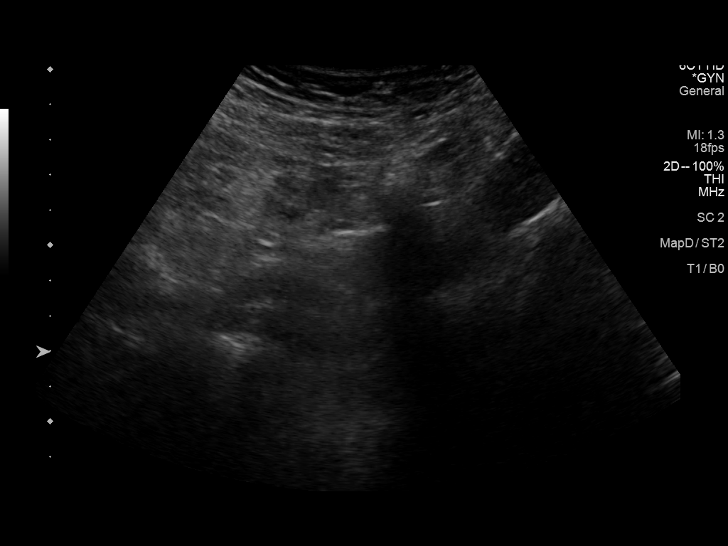
[im 24/58]
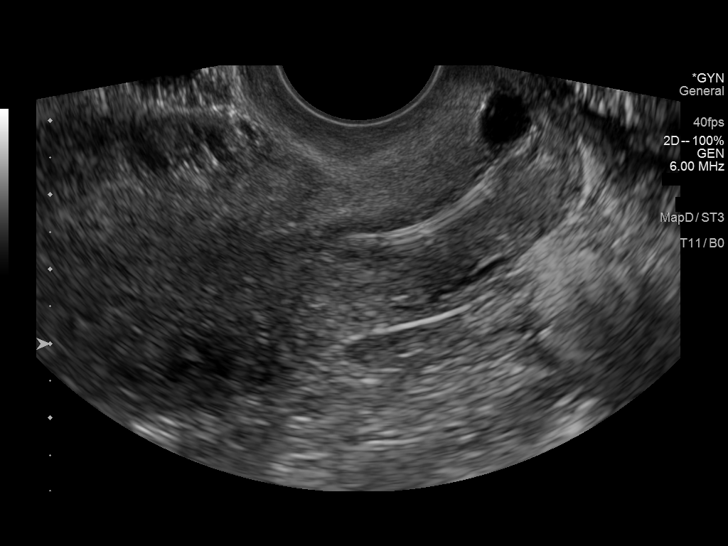
[im 29/58]
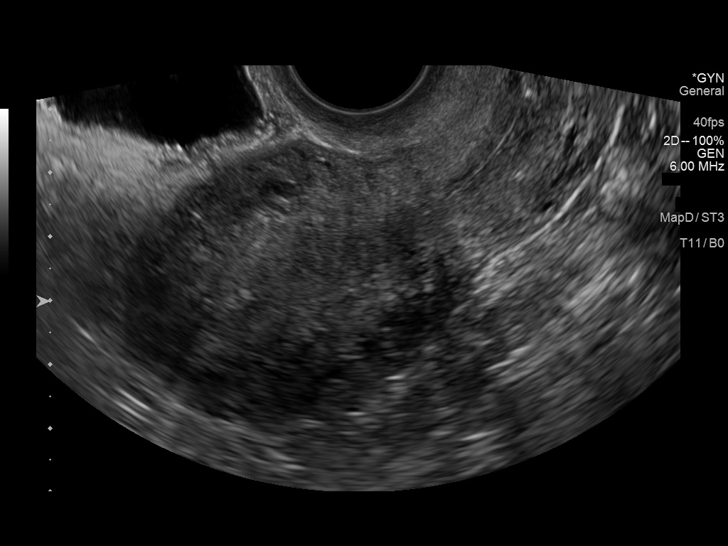
[im 34/58]
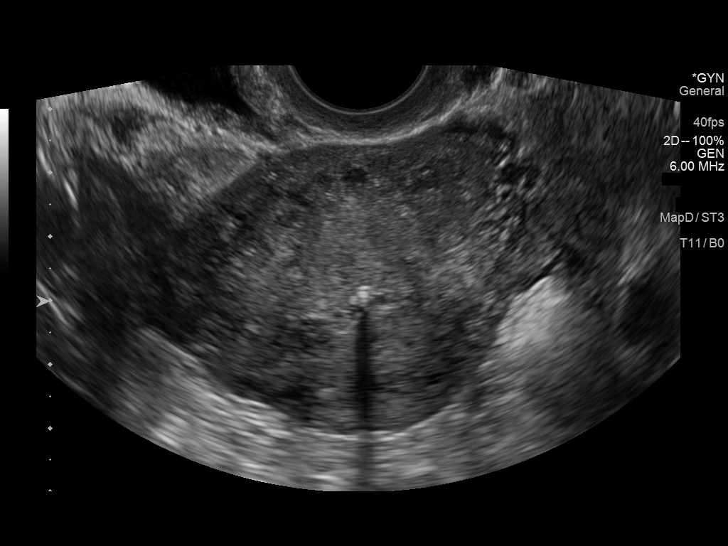
[im 39/58]
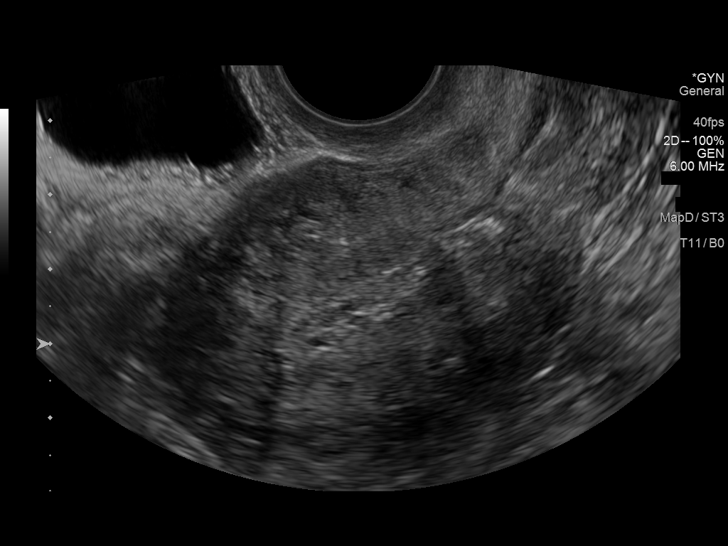
[im 43/58]
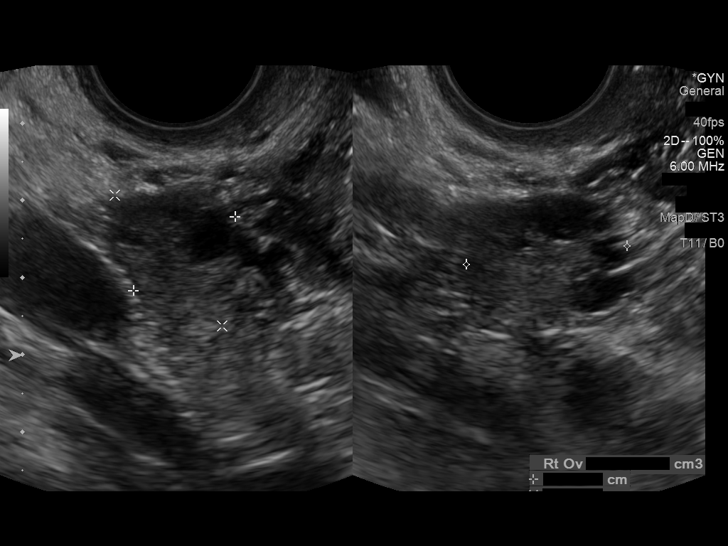
[im 48/58]
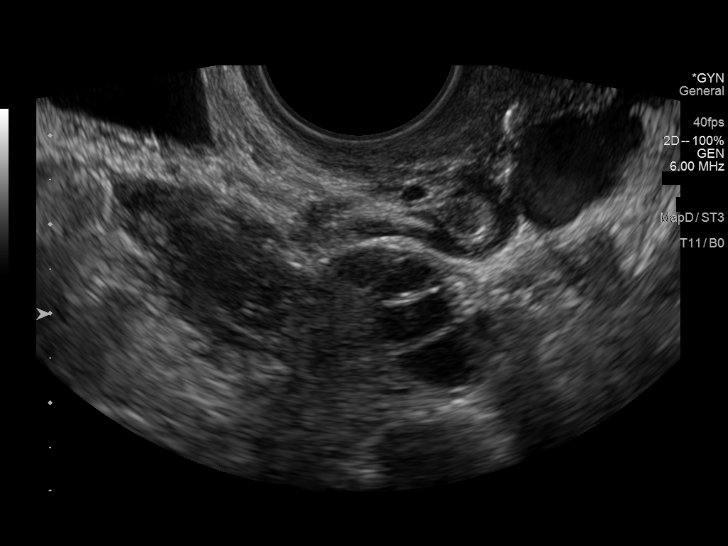
[im 53/58]
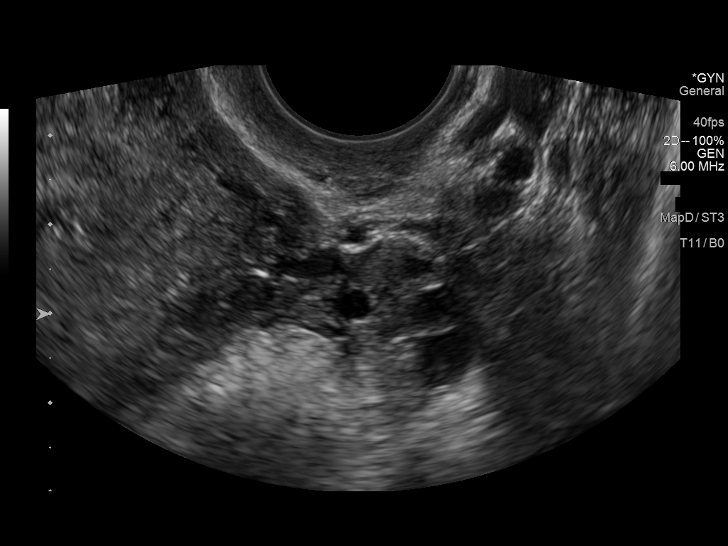
[im 58/58]
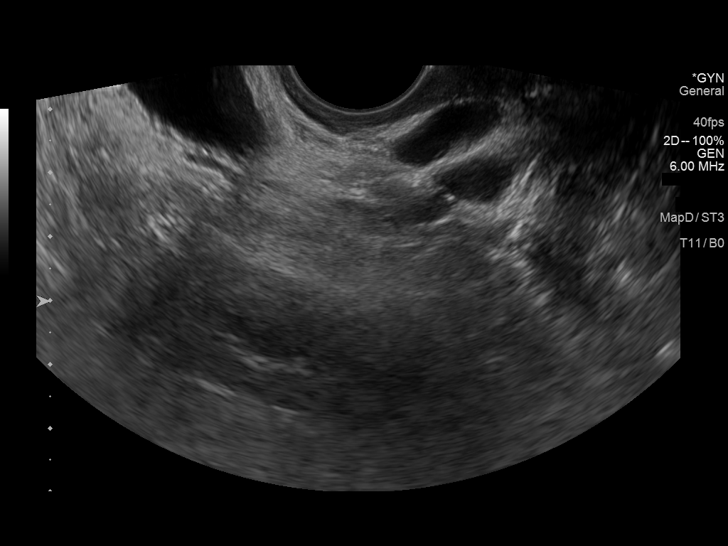

[13 of 25 positions shown; findings below may reference images not displayed]

FINDINGS: Uterus

Measurements: 10.1 x 4.1 x 4.6 cm = volume: 99 mL. Uterus is
anteverted. No discrete fibroid or other mass.

Endometrium

Thickness: 5.1 mm. No focal abnormality visualized. IUD in
appropriate position within the endometrial cavity at the level of
the uterine fundus/body.

Right ovary

Measurements: 1.6 x 2.2 x 2.1 cm = volume: 4.0 mL. Normal
appearance/no adnexal mass.

Left ovary

Measurements: 1.8 x 2.3 x 1.7 cm = volume: 4.0 mL. Normal
appearance/no adnexal mass.

Other findings

No abnormal free fluid.
IMPRESSION: 1. IUD in appropriate position within the endometrial cavity at the
level of the uterine fundus/body.
2. Otherwise unremarkable and normal pelvic ultrasound. No pelvic or
adnexal mass or free fluid.

## 2022-10-31 ENCOUNTER — Telehealth: Payer: Self-pay | Admitting: Internal Medicine

## 2022-10-31 NOTE — Telephone Encounter (Signed)
Patient called and would like to transfer care to Dr Salomon Fick.

## 2022-10-31 NOTE — Telephone Encounter (Signed)
Ok with me 

## 2022-11-07 NOTE — Telephone Encounter (Signed)
Ok

## 2022-11-26 ENCOUNTER — Ambulatory Visit: Payer: 59 | Admitting: Family Medicine

## 2022-11-26 ENCOUNTER — Encounter: Payer: Self-pay | Admitting: Family Medicine

## 2022-11-26 VITALS — BP 110/70 | HR 70 | Temp 98.8°F | Ht 63.3 in | Wt 186.4 lb

## 2022-11-26 DIAGNOSIS — E669 Obesity, unspecified: Secondary | ICD-10-CM | POA: Diagnosis not present

## 2022-11-26 DIAGNOSIS — L0292 Furuncle, unspecified: Secondary | ICD-10-CM | POA: Diagnosis not present

## 2022-11-26 DIAGNOSIS — N6489 Other specified disorders of breast: Secondary | ICD-10-CM | POA: Diagnosis not present

## 2022-11-26 DIAGNOSIS — Z7689 Persons encountering health services in other specified circumstances: Secondary | ICD-10-CM

## 2022-11-26 DIAGNOSIS — Z6832 Body mass index (BMI) 32.0-32.9, adult: Secondary | ICD-10-CM | POA: Diagnosis not present

## 2022-11-26 NOTE — Patient Instructions (Addendum)
Hibiclens 

## 2022-12-02 ENCOUNTER — Encounter: Payer: Self-pay | Admitting: Family Medicine

## 2022-12-12 ENCOUNTER — Other Ambulatory Visit (HOSPITAL_COMMUNITY)
Admission: RE | Admit: 2022-12-12 | Discharge: 2022-12-12 | Disposition: A | Discharge status: Home / Self Care | Payer: 59 | Source: Ambulatory Visit

## 2022-12-12 ENCOUNTER — Ambulatory Visit: Payer: 59

## 2022-12-12 VITALS — BP 140/86 | HR 74 | Ht 62.0 in | Wt 184.6 lb

## 2022-12-12 DIAGNOSIS — Z01419 Encounter for gynecological examination (general) (routine) without abnormal findings: Secondary | ICD-10-CM | POA: Diagnosis present

## 2022-12-12 DIAGNOSIS — Z124 Encounter for screening for malignant neoplasm of cervix: Secondary | ICD-10-CM | POA: Diagnosis not present

## 2022-12-12 DIAGNOSIS — Z1239 Encounter for other screening for malignant neoplasm of breast: Secondary | ICD-10-CM

## 2022-12-12 DIAGNOSIS — Z113 Encounter for screening for infections with a predominantly sexual mode of transmission: Secondary | ICD-10-CM

## 2022-12-12 DIAGNOSIS — T8332XD Displacement of intrauterine contraceptive device, subsequent encounter: Secondary | ICD-10-CM

## 2022-12-12 NOTE — Progress Notes (Signed)
GYNECOLOGY OFFICE VISIT NOTE-WELL WOMAN EXAM  History:   Christine Harris is a 37 year old here today for annual visit. She reports vaginal discharge with odor that increased during intercourse.  She states it is "fishy." She reports pain during intercourse in particular positions. Patient is amenorrheic.   Birth Control:  IUD in place since Oct 2020  Reproductive Concerns  Sexually Active: Yes Partners Type: Female Number of partners in last year: One STD Testing: [employer]  Obstetrical History: G3P1021, SVD without complications Gynecological History: No history of abnormal paps, No gyn surgeries.  Vaginal/GU Concerns: Vaginal concerns as above. No issues with urination, constipation, or diarrhea.  Breast Concerns/Exams: Completing SBE monthly. No concerns. Patient reports paternal great aunts with breast cancer. No other family history of breast, uterine, cervical, or ovarian cancer.  Medical and Nutrition PCP: Dr. Salomon Fick. Last seen 2 weeks for meet Significant PMx: None Exercise: "not like a should" Reports some walking weekly Tobacco/Drugs/Alcohol/Vaping:  Herbal Essence daily Nutrition: Improving, reports cutting back.  Increased veggie intake and cut back on fried foods.   Social Safety at home: Lives with Daughter and SO. Endorses safety. DV/A: Denies Social Support: Endorses Employment: Works at [employer], 5am  Past Medical History:  Diagnosis Date   Abscess    surgery consult 2003   Bacterial vaginosis 12/05/2010   Delayed menses 03/23/2013   6 week tab dec 5th .     Depo-Provera contraceptive status 01/05/2018   H/O varicella    H/O: varicose veins    Medical history non-contributory    Yeast infection     Past Surgical History:  Procedure Laterality Date   HERNIA REPAIR  age 48   TONSILLECTOMY  2010    The following portions of the patient's history were reviewed and updated as appropriate: allergies, current medications, past family history, past  medical history, past social history, past surgical history and problem list.   Health Maintenance: Pap: 11/23/2018, Negative Results-Absent Transformation Zone.  Mammogram: Jan 2022 d/t enlargement of breast.  Colonoscopy: Start at 46 Review of Systems:  Pertinent items noted in HPI and remainder of comprehensive ROS otherwise negative.    Objective:    Physical Exam BP (!) 140/86   Pulse 74   Ht 5\' 2"  (1.575 m)   Wt 184 lb 9.6 oz (83.7 kg)   LMP  (LMP Unknown) Comment: patient is only spotting  BMI 33.76 kg/m  Physical Exam Vitals reviewed. Exam conducted with a chaperone present Lamar Laundry, MA).  Constitutional:      General: She is not in acute distress.    Appearance: Normal appearance.  HENT:     Head: Normocephalic and atraumatic.  Eyes:     Conjunctiva/sclera: Conjunctivae normal.  Cardiovascular:     Rate and Rhythm: Normal rate and regular rhythm.     Heart sounds: Normal heart sounds.  Pulmonary:     Effort: Pulmonary effort is normal.     Breath sounds: Normal breath sounds.  Abdominal:     General: Bowel sounds are normal.     Palpations: Abdomen is soft.     Tenderness: There is no abdominal tenderness.  Genitourinary:    General: Normal vulva.     Comments: Speculum Exam: -Normal External Genitalia: Non tender, no apparent discharge at introitus.  -Vaginal Vault: Pink mucosa with good rugae. Small amt normal discharge -CV collected -Cervix:Pink, no lesions, cysts, or polyps.  Appears closed. No active bleeding from os-IUD Strings not visualized. Pap collected with  broom and spatula.  -Bimanual Exam:  No uterine tenderness or apparent enlargement.    Musculoskeletal:        General: Normal range of motion.     Cervical back: Normal range of motion.  Skin:    General: Skin is warm and dry.  Neurological:     Mental Status: She is alert and oriented to person, place, and time.  Psychiatric:        Mood and Affect: Mood normal.        Behavior: Behavior  normal.      Labs and Imaging No results found for this or any previous visit (from the past 168 hour(s)). No results found.   Assessment & Plan:  37 year old Female Well Woman Exam with Pap Breast Exam STD Testing   1. Well woman exam with routine gynecological exam -Exam performed and findings discussed. -Educated on AHA exercise recommendations of 30 minutes of moderate to vigorous activity at least 5x/week. -Encouraged to activate and utilize Mychart for reviewing of results, communication with office, and scheduling of appts.  2. Pap smear for cervical cancer screening -Educated on ASCCP guidelines regarding pap smear evaluation and frequency. -Informed of turnover time and provider/clinic policy on releasing results. -Pap collected today. History of pap with absent transformation zone.  3. Screening breast examination -CBE performed. -Educated and encouraged to continue monthly SBE with increased breast awareness including examination of breast for skin changes, moles, tenderness, etc.  -Discussed screening mammograms starting at age 31 in absence of first degree relative with diagnosis.   -Reviewed concern with breast asymmetry. Reassured normal in absence of other symptoms. -Discussed considering reconstruction.  4. Screening examination for STD (sexually transmitted disease) -CV collected. -Plan to treat accordingly.   5. Intrauterine contraceptive device threads lost, subsequent encounter -IUD strings not visualized on exam. -[place] in Feb 2022 showed correct IUD placement. Will defer repeat today.   -Patient to report any changes in bleeding pattern or onset of pelvic/abdominal pain for scheduling of [place] as appropriate.   Routine preventative health maintenance measures emphasized. Please refer to After Visit Summary for other counseling recommendations.   Return if symptoms worsen or fail to improve.      Cherre Robins, CNM 12/12/2022

## 2022-12-15 LAB — CERVICOVAGINAL ANCILLARY ONLY
Bacterial Vaginitis (gardnerella): POSITIVE — AB
Candida Glabrata: NEGATIVE
Candida Vaginitis: NEGATIVE
Chlamydia: NEGATIVE
Comment: NEGATIVE
Comment: NEGATIVE
Comment: NEGATIVE
Comment: NEGATIVE
Comment: NEGATIVE
Comment: NORMAL
Neisseria Gonorrhea: NEGATIVE
Trichomonas: NEGATIVE

## 2022-12-16 ENCOUNTER — Other Ambulatory Visit: Payer: Self-pay

## 2022-12-16 DIAGNOSIS — N760 Acute vaginitis: Secondary | ICD-10-CM

## 2022-12-16 LAB — CYTOLOGY - PAP
Comment: NEGATIVE
Diagnosis: NEGATIVE
High risk HPV: NEGATIVE

## 2022-12-16 MED ORDER — METRONIDAZOLE 500 MG PO TABS
500.0000 mg | ORAL_TABLET | Freq: Two times a day (BID) | ORAL | 0 refills | Status: DC
Start: 2022-12-16 — End: 2023-02-18

## 2022-12-18 ENCOUNTER — Other Ambulatory Visit: Payer: Self-pay

## 2022-12-18 DIAGNOSIS — B9689 Other specified bacterial agents as the cause of diseases classified elsewhere: Secondary | ICD-10-CM

## 2022-12-18 MED ORDER — METRONIDAZOLE 0.75 % VA GEL
1.0000 | Freq: Every day | VAGINAL | 1 refills | Status: DC
Start: 2022-12-18 — End: 2023-02-18

## 2023-02-17 ENCOUNTER — Encounter: Payer: 59 | Admitting: Internal Medicine

## 2023-02-17 NOTE — Progress Notes (Deleted)
No chief complaint on file.   HPI: Patient  Christine Harris  37 y.o. comes in today for Preventive Health Care visit   Sees gyne   Health Maintenance  Topic Date Due   INFLUENZA VACCINE  Never done   COVID-19 Vaccine (1 - 2024-25 season) Never done   DTaP/Tdap/Td (2 - Td or Tdap) 12/26/2026   Cervical Cancer Screening (HPV/Pap Cotest)  12/12/2027   Hepatitis C Screening  Completed   HIV Screening  Completed   HPV VACCINES  Aged Out   Health Maintenance Review LIFESTYLE:  Exercise:   Tobacco/ETS: Alcohol:  Sugar beverages: Sleep: Drug use: no HH of  Work:    ROS:  GEN/ HEENT: No fever, significant weight changes sweats headaches vision problems hearing changes, CV/ PULM; No chest pain shortness of breath cough, syncope,edema  change in exercise tolerance. GI /GU: No adominal pain, vomiting, change in bowel habits. No blood in the stool. No significant GU symptoms. SKIN/HEME: ,no acute skin rashes suspicious lesions or bleeding. No lymphadenopathy, nodules, masses.  NEURO/ PSYCH:  No neurologic signs such as weakness numbness. No depression anxiety. IMM/ Allergy: No unusual infections.  Allergy .   REST of 12 system review negative except as per HPI   Past Medical History:  Diagnosis Date   Abscess    surgery consult 2003   Bacterial vaginosis 12/05/2010   Delayed menses 03/23/2013   6 week tab dec 5th .     Depo-Provera contraceptive status 01/05/2018   H/O varicella    H/O: varicose veins    Medical history non-contributory    Yeast infection     Past Surgical History:  Procedure Laterality Date   HERNIA REPAIR  age 8   TONSILLECTOMY  59    Family History  Problem Relation Age of Onset   Diabetes Maternal Grandmother    Colon cancer Maternal Grandmother    Hyperlipidemia Maternal Grandmother    Heart disease Maternal Grandmother    Cancer Maternal Grandmother    Diabetes Maternal Grandfather    Asthma Paternal Grandmother    Heart disease  Paternal Grandmother    Hyperlipidemia Paternal Grandmother    Stroke Paternal Grandmother    Hypertension Mother    Hypertension Father     Social History   Socioeconomic History   Marital status: Single    Spouse name: Not on file   Number of children: Not on file   Years of education: Not on file   Highest education level: Not on file  Occupational History   Not on file  Tobacco Use   Smoking status: Never   Smokeless tobacco: Never  Vaping Use   Vaping status: Never Used  Substance and Sexual Activity   Alcohol use: Yes    Alcohol/week: 0.0 standard drinks of alcohol   Drug use: No   Sexual activity: Yes    Birth control/protection: I.U.D.  Other Topics Concern   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 Weenie dog.    2 cokes per week.    Social Drivers of Corporate investment banker Strain: Not on file  Food Insecurity: Not on file  Transportation Needs: Not on file  Physical Activity: Not on file  Stress: Not on file  Social Connections: Not on file  Outpatient Medications Prior to Visit  Medication Sig Dispense Refill   clindamycin (CLINDAGEL) 1 % gel Apply topically 2 (two) times daily. 30 g 1   ibuprofen (ADVIL,MOTRIN) 600 MG tablet Take 1 tablet (600 mg total) by mouth every 6 (six) hours. 30 tablet 0   metroNIDAZOLE (FLAGYL) 500 MG tablet Take 1 tablet (500 mg total) by mouth 2 (two) times daily. (Patient not taking: Reported on 12/12/2022) 14 tablet 0   metroNIDAZOLE (FLAGYL) 500 MG tablet Take 1 tablet (500 mg total) by mouth 2 (two) times daily. 14 tablet 0   metroNIDAZOLE (METROGEL) 0.75 % vaginal gel Place 1 Applicatorful vaginally at bedtime. Apply one applicatorful to vagina at bedtime for 5 days 70 g 1   Prenatal Vit-Fe Fumarate-FA (MULTIVITAMIN-PRENATAL) 27-0.8 MG TABS tablet Take 1 tablet by mouth daily at 12 noon. (Patient not  taking: Reported on 12/12/2022)     Probiotic Product (CULTURELLE PROBIOTICS PO) Take 1 capsule by mouth daily.     No facility-administered medications prior to visit.     EXAM:  There were no vitals taken for this visit.  There is no height or weight on file to calculate BMI. Wt Readings from Last 3 Encounters:  12/12/22 184 lb 9.6 oz (83.7 kg)  11/26/22 186 lb 6.4 oz (84.6 kg)  03/28/20 171 lb (77.6 kg)    Physical Exam: Vital signs reviewed MVH:QION is a well-developed well-nourished alert cooperative    who appearsr stated age in no acute distress.  HEENT: normocephalic atraumatic , Eyes: PERRL EOM's full, conjunctiva clear, Nares: paten,t no deformity discharge or tenderness., Ears: no deformity EAC's clear TMs with normal landmarks. Mouth: clear OP, no lesions, edema.  Moist mucous membranes. Dentition in adequate repair. NECK: supple without masses, thyromegaly or bruits. CHEST/PULM:  Clear to auscultation and percussion breath sounds equal no wheeze , rales or rhonchi. No chest wall deformities or tenderness. Breast: normal by inspection . No dimpling, discharge, masses, tenderness or discharge . CV: PMI is nondisplaced, S1 S2 no gallops, murmurs, rubs. Peripheral pulses are full without delay.No JVD .  ABDOMEN: Bowel sounds normal nontender  No guard or rebound, no hepato splenomegal no CVA tenderness.  No hernia. Extremtities:  No clubbing cyanosis or edema, no acute joint swelling or redness no focal atrophy NEURO:  Oriented x3, cranial nerves 3-12 appear to be intact, no obvious focal weakness,gait within normal limits no abnormal reflexes or asymmetrical SKIN: No acute rashes normal turgor, color, no bruising or petechiae. PSYCH: Oriented, good eye contact, no obvious depression anxiety, cognition and judgment appear normal. LN: no cervical axillary inguinal adenopathy  Lab Results  Component Value Date   WBC 4.5 07/21/2019   HGB 12.3 07/21/2019   HCT 36.2  07/21/2019   PLT 286.0 07/21/2019   GLUCOSE 100 (H) 07/21/2019   CHOL 117 07/21/2019   TRIG 40.0 07/21/2019   HDL 40.20 07/21/2019   LDLCALC 69 07/21/2019   ALT 13 07/21/2019   AST 13 07/21/2019   NA 139 07/21/2019   K 4.0 07/21/2019   CL 106 07/21/2019   CREATININE 0.72 07/21/2019   BUN 15 07/21/2019   CO2 31 07/21/2019   TSH 1.39 07/21/2019   HGBA1C 5.6 07/21/2019    BP Readings from Last 3 Encounters:  12/12/22 (!) 140/86  11/26/22 110/70  03/28/20 110/72    Lab plan reviewed with patient   ASSESSMENT AND PLAN:  Discussed the following assessment and plan:    ICD-10-CM   1. Visit for preventive  health examination  Z00.00      No follow-ups on file.  Patient Care Team: Broxton Broady, Neta Mends, MD as PCP - General Haygood, Maris Berger, MD (Inactive) (Obstetrics and Gynecology) There are no Patient Instructions on file for this visit.  Neta Mends. Aliza Moret M.D.

## 2023-02-18 ENCOUNTER — Encounter: Payer: Self-pay | Admitting: Family Medicine

## 2023-02-18 ENCOUNTER — Ambulatory Visit: Payer: 59 | Admitting: Family Medicine

## 2023-02-18 VITALS — BP 98/68 | HR 65 | Temp 98.5°F | Ht 62.0 in | Wt 185.0 lb

## 2023-02-18 DIAGNOSIS — Z0000 Encounter for general adult medical examination without abnormal findings: Secondary | ICD-10-CM | POA: Diagnosis not present

## 2023-02-18 LAB — COMPREHENSIVE METABOLIC PANEL
ALT: 12 U/L (ref 0–35)
AST: 15 U/L (ref 0–37)
Albumin: 4.3 g/dL (ref 3.5–5.2)
Alkaline Phosphatase: 40 U/L (ref 39–117)
BUN: 17 mg/dL (ref 6–23)
CO2: 30 meq/L (ref 19–32)
Calcium: 9.2 mg/dL (ref 8.4–10.5)
Chloride: 103 meq/L (ref 96–112)
Creatinine, Ser: 0.77 mg/dL (ref 0.40–1.20)
GFR: 99.4 mL/min (ref >60.00)
Glucose, Bld: 99 mg/dL (ref 70–99)
Potassium: 3.7 meq/L (ref 3.5–5.1)
Sodium: 138 meq/L (ref 135–145)
Total Bilirubin: 0.3 mg/dL (ref 0.2–1.2)
Total Protein: 7.3 g/dL (ref 6.0–8.3)

## 2023-02-18 LAB — LIPID PANEL
Cholesterol: 151 mg/dL (ref 0–200)
HDL: 54.1 mg/dL (ref >39.00)
LDL Cholesterol: 76 mg/dL (ref 0–99)
NonHDL: 96.89
Total CHOL/HDL Ratio: 3
Triglycerides: 102 mg/dL (ref 0.0–149.0)
VLDL: 20.4 mg/dL (ref 0.0–40.0)

## 2023-02-18 LAB — CBC WITH DIFFERENTIAL/PLATELET
Basophils Absolute: 0 10*3/uL (ref 0.0–0.1)
Basophils Relative: 0.3 % (ref 0.0–3.0)
Eosinophils Absolute: 0.1 10*3/uL (ref 0.0–0.7)
Eosinophils Relative: 1.7 % (ref 0.0–5.0)
HCT: 36.5 % (ref 36.0–46.0)
Hemoglobin: 12.2 g/dL (ref 12.0–15.0)
Lymphocytes Relative: 34.8 % (ref 12.0–46.0)
Lymphs Abs: 1.7 10*3/uL (ref 0.7–4.0)
MCHC: 33.5 g/dL (ref 30.0–36.0)
MCV: 93.1 fL (ref 78.0–100.0)
Monocytes Absolute: 0.3 10*3/uL (ref 0.1–1.0)
Monocytes Relative: 7.2 % (ref 3.0–12.0)
Neutro Abs: 2.7 10*3/uL (ref 1.4–7.7)
Neutrophils Relative %: 56 % (ref 43.0–77.0)
Platelets: 308 10*3/uL (ref 150.0–400.0)
RBC: 3.92 Mil/uL (ref 3.87–5.11)
RDW: 12.6 % (ref 11.5–15.5)
WBC: 4.8 10*3/uL (ref 4.0–10.5)

## 2023-02-18 LAB — T4, FREE: Free T4: 1.01 ng/dL (ref 0.60–1.60)

## 2023-02-18 LAB — HEMOGLOBIN A1C: Hgb A1c MFr Bld: 5.9 % (ref 4.6–6.5)

## 2023-02-18 LAB — TSH: TSH: 1.24 u[IU]/mL (ref 0.35–5.50)

## 2023-02-18 NOTE — Progress Notes (Signed)
Established Patient Office Visit   Subjective  Patient ID: Christine Harris, female    DOB: September 17, 1985  Age: 37 y.o. MRN: [id number]  No chief complaint on file.   Patient is a 37 year old female seen for CPE.  Patient states she is doing well overall.  Dealing with some stress as her fianc was involved in a car accident but is doing okay.  Patient denies trouble with sleep.  Tries to keep herself busy.      ROS Negative unless stated above    Objective:     BP 98/68 (BP Location: Right Arm, Patient Position: Sitting, Cuff Size: Large)   Pulse 65   Temp 98.5 F (36.9 C) (Oral)   Ht 5\' 2"  (1.575 m)   Wt 185 lb (83.9 kg)   LMP 02/02/2023 (Exact Date)   SpO2 96%   Breastfeeding No   BMI 33.84 kg/m    Physical Exam Constitutional:      Appearance: Normal appearance.  HENT:     Head: Normocephalic and atraumatic.     Right Ear: Tympanic membrane, ear canal and external ear normal.     Left Ear: Tympanic membrane, ear canal and external ear normal.     Nose: Nose normal.     Mouth/Throat:     Mouth: Mucous membranes are moist.     Pharynx: No oropharyngeal exudate or posterior oropharyngeal erythema.  Eyes:     General: No scleral icterus.    Extraocular Movements: Extraocular movements intact.     Conjunctiva/sclera: Conjunctivae normal.     Pupils: Pupils are equal, round, and reactive to light.  Neck:     Thyroid: No thyromegaly.  Cardiovascular:     Rate and Rhythm: Normal rate and regular rhythm.     Pulses: Normal pulses.     Heart sounds: Normal heart sounds. No murmur heard.    No friction rub.  Pulmonary:     Effort: Pulmonary effort is normal.     Breath sounds: Normal breath sounds. No wheezing, rhonchi or rales.  Abdominal:     General: Bowel sounds are normal.     Palpations: Abdomen is soft.     Tenderness: There is no abdominal tenderness.  Musculoskeletal:        General: No deformity. Normal range of motion.  Lymphadenopathy:      Cervical: No cervical adenopathy.  Skin:    General: Skin is warm and dry.     Findings: No lesion.  Neurological:     General: No focal deficit present.     Mental Status: She is alert and oriented to person, place, and time.  Psychiatric:        Mood and Affect: Mood normal.        Thought Content: Thought content normal.       02/18/2023    1:29 PM 11/26/2022    3:41 PM 07/18/2019    3:44 PM  Depression screen PHQ 2/9  Decreased Interest 1 2 0  Down, Depressed, Hopeless 1 1 0  PHQ - 2 Score 2 3 0  Altered sleeping 0 2 0  Tired, decreased energy 1 2 0  Change in appetite 0 1 0  Feeling bad or failure about yourself  1 1 0  Trouble concentrating 0 1 0  Moving slowly or fidgety/restless 0 0 0  Suicidal thoughts 0 0 0  PHQ-9 Score 4 10 0  Difficult doing work/chores Somewhat difficult Somewhat difficult Not difficult at all  02/18/2023    1:28 PM 11/26/2022    3:42 PM  GAD 7 : Generalized Anxiety Score  Nervous, Anxious, on Edge 0 1  Control/stop worrying 1 1  Worry too much - different things 1 1  Trouble relaxing 1 1  Restless 1 1  Easily annoyed or irritable 1 2  Afraid - awful might happen 0 1  Total GAD 7 Score 5 8  Anxiety Difficulty Somewhat difficult Somewhat difficult      No results found for any visits on 02/18/23.    Assessment & Plan:  Well adult exam  -Age-appropriate health screenings discussed -Will obtain labs.  Patient is not fasting. -Immunizations reviewed -Pap done with OB/GYN 12/2022 and negative -PHQ-9 score 4 and GAD-7 score 5.  Discussed the importance of self-care -Next CPE in 1 year  Return if symptoms worsen or fail to improve.   Deeann Saint, MD

## 2023-08-24 ENCOUNTER — Ambulatory Visit: Admitting: Family Medicine

## 2023-08-24 ENCOUNTER — Encounter: Payer: Self-pay | Admitting: Family Medicine

## 2023-08-24 VITALS — BP 110/90 | HR 83 | Temp 98.6°F | Ht 62.0 in | Wt 189.6 lb

## 2023-08-24 DIAGNOSIS — F4323 Adjustment disorder with mixed anxiety and depressed mood: Secondary | ICD-10-CM

## 2023-08-24 DIAGNOSIS — Z566 Other physical and mental strain related to work: Secondary | ICD-10-CM

## 2023-08-24 DIAGNOSIS — R030 Elevated blood-pressure reading, without diagnosis of hypertension: Secondary | ICD-10-CM | POA: Diagnosis not present

## 2023-08-24 NOTE — Progress Notes (Signed)
 Established Patient Office Visit   Subjective  Patient ID: Christine Harris, female    DOB: Feb 05, 1986  Age: 38 y.o. MRN: [id number]  Chief Complaint  Patient presents with   Medical Management of Chronic Issues    Anxeity and work stress, FMLA paperwork    Patient is 38 year old female seen for acute concern.  Patient states she has been under increased stress at work and in personal life.  Pt mentions back in December her boyfriend was involved in an accident which has been stressful.  Pt also states she has been under increased stress dealing with her manager at work and with customers.  Mentions situation to HR but no resolution noted.  Pt is employed by [employer], [employer].  States had to calm herself down the other day while dealing with a customer on the phone in order to avoid saying something inappropriate.  Pt took off last Thursday for Juneteenth and the Friday, her first break this year.  Pt requesting FMLA forms to be completed.  States is looking into counseling but may have an appointment next month.  Pt is against medication options.  States does not have time for self-care.  Pt inquires if she needs a doctor's note to take 2 weeks of sick leave.    Patient Active Problem List   Diagnosis Date Noted   Transformation zone absent on cervical Pap smear 12/13/2018   Recurrent boils 10/29/2011   Body piercing 10/29/2011   Past Medical History:  Diagnosis Date   Abscess    surgery consult 2003   Bacterial vaginosis 12/05/2010   Delayed menses 03/23/2013   6 week tab dec 5th .     Depo-Provera  contraceptive status 01/05/2018   H/O varicella    H/O: varicose veins    Medical history non-contributory    Yeast infection    Past Surgical History:  Procedure Laterality Date   HERNIA REPAIR  age 40   TONSILLECTOMY  86   Social History   Tobacco Use   Smoking status: Never   Smokeless tobacco: Never  Vaping Use   Vaping status: Never Used  Substance Use Topics    Alcohol use: Yes    Alcohol/week: 0.0 standard drinks of alcohol   Drug use: No   Family History  Problem Relation Age of Onset   Diabetes Maternal Grandmother    Colon cancer Maternal Grandmother    Hyperlipidemia Maternal Grandmother    Heart disease Maternal Grandmother    Cancer Maternal Grandmother    Diabetes Maternal Grandfather    Asthma Paternal Grandmother    Heart disease Paternal Grandmother    Hyperlipidemia Paternal Grandmother    Stroke Paternal Grandmother    Hypertension Mother    Hypertension Father    Allergies  Allergen Reactions   Codeine Itching    ROS Negative unless stated above    Objective:     BP (!) 110/90 (BP Location: Left Arm, Patient Position: Sitting, Cuff Size: Normal)   Pulse 83   Temp 98.6 F (37 C) (Oral)   Ht 5' 2 (1.575 m)   Wt 189 lb 9.6 oz (86 kg)   SpO2 96%   BMI 34.68 kg/m  BP Readings from Last 3 Encounters:  08/24/23 (!) 110/90  02/18/23 98/68  12/12/22 (!) 140/86   Wt Readings from Last 3 Encounters:  08/24/23 189 lb 9.6 oz (86 kg)  02/18/23 185 lb (83.9 kg)  12/12/22 184 lb 9.6 oz (83.7 kg)  Physical Exam Constitutional:      Appearance: Normal appearance.  HENT:     Head: Normocephalic and atraumatic.     Nose: Nose normal.     Mouth/Throat:     Mouth: Mucous membranes are moist.   Cardiovascular:     Rate and Rhythm: Normal rate.  Pulmonary:     Effort: Pulmonary effort is normal.   Skin:    General: Skin is warm and dry.   Neurological:     Mental Status: She is alert and oriented to person, place, and time.       08/24/2023    5:02 PM 02/18/2023    1:28 PM 11/26/2022    3:42 PM  GAD 7 : Generalized Anxiety Score  Nervous, Anxious, on Edge 2 0 1  Control/stop worrying 3 1 1   Worry too much - different things 3 1 1   Trouble relaxing 2 1 1   Restless 2 1 1   Easily annoyed or irritable 2 1 2   Afraid - awful might happen 2 0 1  Total GAD 7 Score 16 5 8   Anxiety Difficulty Very  difficult Somewhat difficult Somewhat difficult      08/24/2023    5:02 PM 02/18/2023    1:29 PM 11/26/2022    3:41 PM  Depression screen PHQ 2/9  Decreased Interest 2 1 2   Down, Depressed, Hopeless 2 1 1   PHQ - 2 Score 4 2 3   Altered sleeping 2 0 2  Tired, decreased energy 3 1 2   Change in appetite 3 0 1  Feeling bad or failure about yourself  2 1 1   Trouble concentrating 2 0 1  Moving slowly or fidgety/restless 2 0 0  Suicidal thoughts 0 0 0  PHQ-9 Score 18 4 10   Difficult doing work/chores  Somewhat difficult Somewhat difficult    No results found for any visits on 08/24/23.    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  PHQ-9 score 18 and GAD-7 score 16.  Currently does not have time for self-care.  Is looking into counseling and may have an appointment next month?.  Pt declines medication options.  Advised unable to complete FMLA at this time as this is the first visit for symptoms and patient is not actively in treatment or on medication.  Return if symptoms worsen or fail to improve.   Clotilda JONELLE Single, MD

## 2023-08-24 NOTE — Addendum Note (Signed)
 Addended by: MERCER KIRSCH R on: 08/24/2023 05:06 PM   Modules accepted: Level of Service

## 2023-08-25 ENCOUNTER — Telehealth: Payer: Self-pay

## 2023-08-25 NOTE — Telephone Encounter (Signed)
 Copied from CRM [id number]. Topic: General - Other >> Aug 25, 2023 10:01 AM Chiquita SQUIBB wrote: Reason for CRM: [employer] calling to confirm the patients last office visit and future office visit appointments. [employer] would also like to confirm the treatment plan with the clinical staff. The number number is [phone] Reference number [id number]

## 2023-08-25 NOTE — Telephone Encounter (Signed)
 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
